# Patient Record
Sex: Female | Born: 1990
Health system: Southern US, Community
[De-identification: ages and names within clinical notes are randomized; demographics above are authoritative.]

## PROBLEM LIST (undated history)

## (undated) ENCOUNTER — Inpatient Hospital Stay (HOSPITAL_COMMUNITY): Payer: Self-pay

## (undated) DIAGNOSIS — T7840XA Allergy, unspecified, initial encounter: Secondary | ICD-10-CM

## (undated) DIAGNOSIS — A609 Anogenital herpesviral infection, unspecified: Secondary | ICD-10-CM

## (undated) DIAGNOSIS — T8859XA Other complications of anesthesia, initial encounter: Secondary | ICD-10-CM

## (undated) DIAGNOSIS — T4145XA Adverse effect of unspecified anesthetic, initial encounter: Secondary | ICD-10-CM

## (undated) DIAGNOSIS — F419 Anxiety disorder, unspecified: Secondary | ICD-10-CM

## (undated) HISTORY — DX: Anxiety disorder, unspecified: F41.9

## (undated) HISTORY — PX: CHOLECYSTECTOMY: SHX55

## (undated) HISTORY — DX: Allergy, unspecified, initial encounter: T78.40XA

## (undated) HISTORY — DX: Anogenital herpesviral infection, unspecified: A60.9

---
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
## 2015-04-09 ENCOUNTER — Emergency Department (HOSPITAL_COMMUNITY)
Admission: EM | Admit: 2015-04-09 | Discharge: 2015-04-10 | Disposition: A | Discharge status: Home / Self Care | Payer: Self-pay

## 2015-08-17 ENCOUNTER — Ambulatory Visit (INDEPENDENT_AMBULATORY_CARE_PROVIDER_SITE_OTHER): Payer: BLUE CROSS/BLUE SHIELD | Admitting: Family Medicine

## 2015-08-17 VITALS — BP 102/60 | HR 65 | Temp 98.4°F | Resp 16 | Ht 65.3 in | Wt 138.0 lb

## 2015-08-17 DIAGNOSIS — J029 Acute pharyngitis, unspecified: Secondary | ICD-10-CM | POA: Diagnosis not present

## 2015-08-17 MED ORDER — AMOXICILLIN 500 MG PO CAPS: 500.0000 mg | ORAL_CAPSULE | Freq: Three times a day (TID) | ORAL | Status: DC

## 2015-08-17 NOTE — Progress Notes (Signed)
Subjective:  This chart was scribed for Joan Sidle MD, by Joan Guerra, at [facility].  This patient was seen in room 2 and the patient's care was started at 9:30 AM.   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   MRN: [id number]  HPI  HPI Comments: Joan Guerra is a 24 y.o. female who presents to the [facility] complaining of a sore throat onset two days ago with associated symptoms of a headache, sinus pressure and "foggy ears".  She took Advil Cold and Sinus this morning but denies any relief.   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   Denies nausea or vomiting.      There are no active problems to display for this patient.  Past Medical History  Diagnosis Date   Allergy    Anxiety    Past Surgical History  Procedure Laterality Date   Cholecystectomy     Allergies  Allergen Reactions   Azithromycin Rash   Prior to Admission medications   Not on File   Social History   Social History   Marital Status: Married    Spouse Name: N/A   Number of Children: N/A   Years of Education: N/A   Occupational History   Not on file.   Social History Main Topics   Smoking status: Never Smoker    Smokeless tobacco: Not on file   Alcohol Use: 1.2 oz/week    2 Standard drinks or equivalent per week   Drug Use: No   Sexual Activity: Not on file   Other Topics Concern   Not on file   Social History Narrative   No narrative on file     Review of Systems  Constitutional: Negative for fever and chills.  HENT: Positive for sinus pressure and sore throat.   Eyes: Negative for pain, redness and itching.  Respiratory: Negative  for cough, choking and shortness of breath.   Gastrointestinal: Negative for nausea and vomiting.  Musculoskeletal: Negative for neck pain and neck stiffness.  Neurological: Positive for headaches.       Objective:   Physical Exam  Constitutional: She is oriented to person, place, and time. She appears well-developed and well-nourished. No distress.  HENT:  Head: Normocephalic and atraumatic.  Right Ear: External ear normal.  Left Ear: External ear normal.  Posterior pharynx is red and mildly swollen without exudates  Eyes: Pupils are equal, round, and reactive to light.  Neck: Normal range of motion. Neck supple.  Cardiovascular: Normal rate and regular rhythm.   Pulmonary/Chest: Effort normal and breath sounds normal. No respiratory distress.  Musculoskeletal: Normal range of motion.  Lymphadenopathy:    She has no cervical adenopathy.  Neurological: She is alert and oriented to person, place, and time.  Skin: Skin is warm and dry.    Filed Vitals:   08/17/15 0906  BP: 102/60  Pulse: 65  Temp: 98.4 F (36.9 C)  TempSrc: Oral  Resp: 16  Height: 5' 5.3" (1.659 m)  Weight: 138 lb (62.596 kg)  SpO2: 98%         Assessment & Plan:   This  chart was scribed in my presence and reviewed by me personally.    ICD-9-CM ICD-10-CM   1. Sore throat 462 J02.9 Culture, Group A Strep     amoxicillin (AMOXIL) 500 MG capsule     Signed, Joan SidleKurt Lauenstein, MD

## 2015-08-17 NOTE — Patient Instructions (Signed)
3 books on history: March of the 10,000 , 1453, battle of the ice

## 2015-08-18 LAB — CULTURE, GROUP A STREP: Organism ID, Bacteria: NORMAL

## 2016-09-17 NOTE — L&D Delivery Note (Signed)
Delivery Note At 5:37 AM, on August 31, 2017, a viable female "Joan Guerra" was delivered via  (Presentation:Left Occiput Anterior with restitution to ROT ). Shoulders delivered easily and infant with good tone and spontaneous cry. Tactile stimulation given by provider and infant placed on mother's abdomen where nurse continued tactile stimulation. Infant   APGAR: 8, 9. Cord clamped, cut, and blood collected. Placenta delivered spontaneously and noted to be intact with 3VC upon inspection.  Vaginal inspection revealed bilateral periurethral lacerations that was repaired with 3-0 vicryl on SH.  ~175mL of 1% lidocaine necessary and patient tolerated the procedure well. Fundus firm, at the umbilicus, and bleeding small.  Mother hemodynamically stable and infant skin to skin prior to provider exit.  Mother unsure of birth control method and opts to breastfeed.  Infant weight at one hour of life: Pending  Anesthesia:  Epidural, Local for Repair Episiotomy: None Lacerations: Periurethral Suture Repair: 3.0 vicryl Est. Blood Loss (mL):    Mom to postpartum.  Baby to Couplet care / Skin to Skin.  Cherre RobinsJessica L Divina Neale MSN, CNM 08/31/2017, 6:16 AM

## 2016-10-03 ENCOUNTER — Ambulatory Visit (INDEPENDENT_AMBULATORY_CARE_PROVIDER_SITE_OTHER): Payer: BLUE CROSS/BLUE SHIELD | Admitting: Orthopaedic Surgery

## 2017-01-07 LAB — OB RESULTS CONSOLE HEPATITIS B SURFACE ANTIGEN: Hepatitis B Surface Ag: NEGATIVE

## 2017-01-07 LAB — OB RESULTS CONSOLE ANTIBODY SCREEN: Antibody Screen: NEGATIVE

## 2017-01-07 LAB — OB RESULTS CONSOLE HIV ANTIBODY (ROUTINE TESTING): HIV: NONREACTIVE

## 2017-01-07 LAB — OB RESULTS CONSOLE RPR: RPR: NONREACTIVE

## 2017-01-07 LAB — OB RESULTS CONSOLE ABO/RH: RH Type: POSITIVE

## 2017-01-07 LAB — OB RESULTS CONSOLE RUBELLA ANTIBODY, IGM: Rubella: IMMUNE

## 2017-02-07 LAB — OB RESULTS CONSOLE GC/CHLAMYDIA
Chlamydia: NEGATIVE
Gonorrhea: NEGATIVE

## 2017-02-14 ENCOUNTER — Other Ambulatory Visit: Payer: Self-pay | Admitting: Obstetrics and Gynecology

## 2017-02-14 ENCOUNTER — Other Ambulatory Visit (HOSPITAL_COMMUNITY)
Admission: RE | Admit: 2017-02-14 | Discharge: 2017-02-14 | Disposition: A | Discharge status: Home / Self Care | Payer: BLUE CROSS/BLUE SHIELD | Source: Ambulatory Visit | Attending: Obstetrics and Gynecology | Admitting: Obstetrics and Gynecology

## 2017-02-14 DIAGNOSIS — Z124 Encounter for screening for malignant neoplasm of cervix: Secondary | ICD-10-CM | POA: Diagnosis present

## 2017-02-18 LAB — CYTOLOGY - PAP
Chlamydia: NEGATIVE
Diagnosis: NEGATIVE
Neisseria Gonorrhea: NEGATIVE

## 2017-03-10 ENCOUNTER — Telehealth: Payer: Self-pay | Admitting: Obstetrics and Gynecology

## 2017-03-10 NOTE — Telephone Encounter (Signed)
TC from patient--16 weeks, patient of Dr. Richardson Doppole, with mid back tightness/soreness, just under ribcage, moderate discomfort.  Denies dysuria, urgency, fever, d/c, bleeding, or any other sx.  Used [employer] Balm with benefit.  Encouraged to contact [facility] tomorrow for evaluation, call after hours with any worsening sx.

## 2017-07-31 LAB — OB RESULTS CONSOLE GBS: GBS: NEGATIVE

## 2017-08-16 ENCOUNTER — Institutional Professional Consult (permissible substitution): Payer: BLUE CROSS/BLUE SHIELD | Admitting: Pediatrics

## 2017-08-28 ENCOUNTER — Encounter (HOSPITAL_COMMUNITY): Payer: Self-pay | Admitting: *Deleted

## 2017-08-28 ENCOUNTER — Telehealth (HOSPITAL_COMMUNITY): Payer: Self-pay | Admitting: *Deleted

## 2017-08-28 NOTE — Telephone Encounter (Signed)
Preadmission screen  

## 2017-08-29 ENCOUNTER — Inpatient Hospital Stay (HOSPITAL_COMMUNITY)
Admission: AD | Admit: 2017-08-29 | Discharge: 2017-08-30 | Disposition: A | Discharge status: Home / Self Care | Payer: BLUE CROSS/BLUE SHIELD | Source: Ambulatory Visit | Attending: Obstetrics and Gynecology | Admitting: Obstetrics and Gynecology

## 2017-08-29 DIAGNOSIS — O479 False labor, unspecified: Secondary | ICD-10-CM

## 2017-08-30 ENCOUNTER — Encounter (HOSPITAL_COMMUNITY): Payer: Self-pay

## 2017-08-30 ENCOUNTER — Inpatient Hospital Stay (HOSPITAL_COMMUNITY)
Admission: AD | Admit: 2017-08-30 | Discharge: 2017-09-01 | DRG: 807 | Disposition: A | Discharge status: Home / Self Care | Payer: BLUE CROSS/BLUE SHIELD | Source: Ambulatory Visit | Attending: Obstetrics & Gynecology | Admitting: Obstetrics & Gynecology

## 2017-08-30 ENCOUNTER — Inpatient Hospital Stay (HOSPITAL_COMMUNITY): Payer: BLUE CROSS/BLUE SHIELD | Admitting: Anesthesiology

## 2017-08-30 ENCOUNTER — Other Ambulatory Visit: Payer: Self-pay | Admitting: Obstetrics and Gynecology

## 2017-08-30 DIAGNOSIS — Z3A40 40 weeks gestation of pregnancy: Secondary | ICD-10-CM | POA: Diagnosis not present

## 2017-08-30 DIAGNOSIS — O26893 Other specified pregnancy related conditions, third trimester: Secondary | ICD-10-CM | POA: Diagnosis present

## 2017-08-30 DIAGNOSIS — Z3483 Encounter for supervision of other normal pregnancy, third trimester: Secondary | ICD-10-CM | POA: Diagnosis present

## 2017-08-30 DIAGNOSIS — R030 Elevated blood-pressure reading, without diagnosis of hypertension: Secondary | ICD-10-CM | POA: Diagnosis present

## 2017-08-30 LAB — CBC
HCT: 35.8 % — ABNORMAL LOW (ref 36.0–46.0)
Hemoglobin: 12.6 g/dL (ref 12.0–15.0)
MCH: 31.7 pg (ref 26.0–34.0)
MCHC: 35.2 g/dL (ref 30.0–36.0)
MCV: 90.2 fL (ref 78.0–100.0)
Platelets: 304 10*3/uL (ref 150–400)
RBC: 3.97 MIL/uL (ref 3.87–5.11)
RDW: 12.8 % (ref 11.5–15.5)
WBC: 19.7 10*3/uL — ABNORMAL HIGH (ref 4.0–10.5)

## 2017-08-30 LAB — TYPE AND SCREEN
ABO/RH(D): A POS
Antibody Screen: NEGATIVE

## 2017-08-30 LAB — PROTEIN / CREATININE RATIO, URINE
Creatinine, Urine: 93 mg/dL
Protein Creatinine Ratio: 0.15 mg/mg{Cre} (ref 0.00–0.15)
Total Protein, Urine: 14 mg/dL

## 2017-08-30 LAB — ABO/RH: ABO/RH(D): A POS

## 2017-08-30 MED ORDER — ACETAMINOPHEN 325 MG PO TABS: 650.0000 mg | ORAL_TABLET | ORAL | Status: DC | PRN

## 2017-08-30 MED ORDER — SOD CITRATE-CITRIC ACID 500-334 MG/5ML PO SOLN: 30.0000 mL | ORAL | Status: DC | PRN

## 2017-08-30 MED ORDER — LACTATED RINGERS IV SOLN
INTRAVENOUS | Status: DC
  Administered 2017-08-30 (×3): via INTRAVENOUS

## 2017-08-30 MED ORDER — OXYCODONE-ACETAMINOPHEN 5-325 MG PO TABS: 1.0000 | ORAL_TABLET | ORAL | Status: DC | PRN

## 2017-08-30 MED ORDER — EPHEDRINE 5 MG/ML INJ
10.0000 mg | INTRAVENOUS | Status: DC | PRN
  Filled 2017-08-30: qty 2

## 2017-08-30 MED ORDER — OXYTOCIN 40 UNITS IN LACTATED RINGERS INFUSION - SIMPLE MED
1.0000 m[IU]/min | INTRAVENOUS | Status: DC
Start: 2017-08-31 — End: 2017-08-31
  Administered 2017-08-31: 1 m[IU]/min via INTRAVENOUS
  Filled 2017-08-30: qty 1000

## 2017-08-30 MED ORDER — OXYTOCIN BOLUS FROM INFUSION
500.0000 mL | Freq: Once | INTRAVENOUS | Status: AC
  Administered 2017-08-31: 500 mL via INTRAVENOUS

## 2017-08-30 MED ORDER — PHENYLEPHRINE 40 MCG/ML (10ML) SYRINGE FOR IV PUSH (FOR BLOOD PRESSURE SUPPORT)
80.0000 ug | PREFILLED_SYRINGE | INTRAVENOUS | Status: AC | PRN
  Administered 2017-08-30 (×3): 80 ug via INTRAVENOUS

## 2017-08-30 MED ORDER — LACTATED RINGERS IV SOLN: 500.0000 mL | Freq: Once | INTRAVENOUS | Status: DC

## 2017-08-30 MED ORDER — ONDANSETRON HCL 4 MG/2ML IJ SOLN
4.0000 mg | Freq: Four times a day (QID) | INTRAMUSCULAR | Status: DC | PRN
  Administered 2017-08-30: 4 mg via INTRAVENOUS
  Filled 2017-08-30: qty 2

## 2017-08-30 MED ORDER — OXYCODONE-ACETAMINOPHEN 5-325 MG PO TABS: 2.0000 | ORAL_TABLET | ORAL | Status: DC | PRN

## 2017-08-30 MED ORDER — DIPHENHYDRAMINE HCL 50 MG/ML IJ SOLN: 12.5000 mg | INTRAMUSCULAR | Status: DC | PRN

## 2017-08-30 MED ORDER — LIDOCAINE HCL (PF) 1 % IJ SOLN
30.0000 mL | INTRAMUSCULAR | Status: DC | PRN
  Administered 2017-08-31: 30 mL via SUBCUTANEOUS
  Filled 2017-08-30: qty 30

## 2017-08-30 MED ORDER — FENTANYL 2.5 MCG/ML BUPIVACAINE 1/10 % EPIDURAL INFUSION (WH - ANES)
14.0000 mL/h | INTRAMUSCULAR | Status: DC | PRN
  Administered 2017-08-30: 2 mL/h via EPIDURAL
  Filled 2017-08-30: qty 100

## 2017-08-30 MED ORDER — LIDOCAINE HCL (PF) 1 % IJ SOLN
INTRAMUSCULAR | Status: DC | PRN
  Administered 2017-08-30: 4 mL

## 2017-08-30 MED ORDER — LACTATED RINGERS IV SOLN
500.0000 mL | Freq: Once | INTRAVENOUS | Status: AC
  Administered 2017-08-30: 500 mL via INTRAVENOUS

## 2017-08-30 MED ORDER — TERBUTALINE SULFATE 1 MG/ML IJ SOLN
0.2500 mg | Freq: Once | INTRAMUSCULAR | Status: DC | PRN
  Filled 2017-08-30: qty 1

## 2017-08-30 MED ORDER — OXYTOCIN 40 UNITS IN LACTATED RINGERS INFUSION - SIMPLE MED
2.5000 [IU]/h | INTRAVENOUS | Status: DC
  Administered 2017-08-31: 2.5 [IU]/h via INTRAVENOUS

## 2017-08-30 MED ORDER — FENTANYL CITRATE (PF) 100 MCG/2ML IJ SOLN: 50.0000 ug | INTRAMUSCULAR | Status: DC | PRN

## 2017-08-30 MED ORDER — LACTATED RINGERS IV SOLN
500.0000 mL | INTRAVENOUS | Status: DC | PRN
  Administered 2017-08-30: 500 mL via INTRAVENOUS

## 2017-08-30 MED ORDER — PHENYLEPHRINE 40 MCG/ML (10ML) SYRINGE FOR IV PUSH (FOR BLOOD PRESSURE SUPPORT)
80.0000 ug | PREFILLED_SYRINGE | INTRAVENOUS | Status: DC | PRN
  Administered 2017-08-30: 80 ug via INTRAVENOUS
  Filled 2017-08-30: qty 5
  Filled 2017-08-30: qty 10

## 2017-08-30 NOTE — H&P (Deleted)
  The note originally documented on this encounter has been moved the the encounter in which it belongs.  

## 2017-08-30 NOTE — Progress Notes (Signed)
Joan Guerra MRN: [id number]  Subjective: -Care assumed of 26 y.o. G1P0 at 3129w2d who presents for active labor. [facility] is under the care of  [facility] and is attended by Dr. Delrae Sawyers. [name]. Pregnancy and medical history unremarkable.  In room to meet acquaintance of patient and family.  Patient reports discomfort despite epidural placement.  However, patient breathing well through contractions.  No q/c.  Objective: BP (!) 137/92   Pulse (!) 110   Temp 98.9 F (37.2 C) (Oral)   Resp 19   Ht 5\' 5"  (1.651 m)   Wt 74.8 kg (165 lb)   LMP 11/21/2016   SpO2 100%   BMI 27.46 kg/m  No intake/output data recorded. No intake/output data recorded.  Fetal Monitoring: FHT: 145 bpm, Mod Var, +Variable Decels, +Accels UC: Q 2-653min, palpates moderate  Physical Exam: General appearance: oriented to person, place, and time and in mild to moderate distress. Chest: normal rate and regular rhythm.  clear to auscultation, no wheezes, rales or rhonchi, symmetric air entry. Abdominal exam: Soft, RT, BS, Gravid. Extremities: No Edema Skin exam: Warm Dry  Vaginal Exam: SVE:   Dilation: 8 Effacement (%): 100 Station: 0 Exam by:: Joan SwazilandJordan Johnson, RN  Membranes:SROM Internal Monitors: None  Augmentation/Induction: Pitocin:None Cytotec: None  Assessment:  IUP at 40.2wks Cat I FT  Overall Transitional Labor Elevated BP  Plan: -Discussed plan to get patient comfortable as established by anesthesiologist and nurse -PC Ratio  -Will perform PIH labs if PCR abnormal -Continue other mgmt as ordered  Valma CavaJessica L Nyana Haren,MSN, CNM 08/30/2017, 8:13 PM

## 2017-08-30 NOTE — MAU Note (Signed)
Reports cntrx 2 mins apart for past 2 hours.  No leaking or bleeding. + FM

## 2017-08-30 NOTE — Anesthesia Procedure Notes (Signed)
Epidural Patient location during procedure: OB Start time: 08/30/2017 6:10 PM End time: 08/30/2017 6:16 PM  Staffing Anesthesiologist: Shelton SilvasHollis, Guy Toney D, MD Performed: anesthesiologist   Preanesthetic Checklist Completed: patient identified, site marked, surgical consent, pre-op evaluation, timeout performed, IV checked, risks and benefits discussed and monitors and equipment checked  Epidural Patient position: sitting Prep: ChloraPrep Patient monitoring: heart rate, continuous pulse ox and blood pressure Approach: midline Location: L3-L4 Injection technique: LOR saline  Needle:  Needle type: Tuohy  Needle gauge: 17 G Needle length: 9 cm Catheter type: closed end flexible Catheter size: 20 Guage Test dose: negative and 1.5% lidocaine  Assessment Events: blood not aspirated, injection not painful, no injection resistance and no paresthesia  Additional Notes LOR @ 4  Patient identified. Risks/Benefits/Options discussed with patient including but not limited to bleeding, infection, nerve damage, paralysis, failed block, incomplete pain control, headache, blood pressure changes, nausea, vomiting, reactions to medications, itching and postpartum back pain. Confirmed with bedside nurse the patient's most recent platelet count. Confirmed with patient that they are not currently taking any anticoagulation, have any bleeding history or any family history of bleeding disorders. Patient expressed understanding and wished to proceed. All questions were answered. Sterile technique was used throughout the entire procedure. Please see nursing notes for vital signs. Test dose was given through epidural catheter and negative prior to continuing to dose epidural or start infusion. Warning signs of high block given to the patient including shortness of breath, tingling/numbness in hands, complete motor block, or any concerning symptoms with instructions to call for help. Patient was given instructions  on fall risk and not to get out of bed. All questions and concerns addressed with instructions to call with any issues or inadequate analgesia.    Reason for block:procedure for pain

## 2017-08-30 NOTE — Anesthesia Preprocedure Evaluation (Signed)
Anesthesia Evaluation  Patient identified by MRN, date of birth, ID band Patient awake    Reviewed: Allergy & Precautions, Patient's Chart, lab work & pertinent test results  Airway Mallampati: I       Dental no notable dental hx.    Pulmonary    Pulmonary exam normal        Cardiovascular negative cardio ROS Normal cardiovascular exam     Neuro/Psych Anxiety    GI/Hepatic   Endo/Other    Renal/GU negative Renal ROS     Musculoskeletal   Abdominal   Peds  Hematology negative hematology ROS (+)   Anesthesia Other Findings   Reproductive/Obstetrics (+) Pregnancy                             Anesthesia Physical Anesthesia Plan  ASA: II  Anesthesia Plan: Epidural   Post-op Pain Management:    Induction:   PONV Risk Score and Plan:   Airway Management Planned:   Additional Equipment:   Intra-op Plan:   Post-operative Plan:   Informed Consent: I have reviewed the patients History and Physical, chart, labs and discussed the procedure including the risks, benefits and alternatives for the proposed anesthesia with the patient or authorized representative who has indicated his/her understanding and acceptance.     Plan Discussed with:   Anesthesia Plan Comments: (Lab Results               PLT                      304                 08/30/2017           )        Anesthesia Quick Evaluation

## 2017-08-30 NOTE — Anesthesia Pain Management Evaluation Note (Signed)
  CRNA Pain Management Visit Note  Patient: Joan Guerra, 26 y.o., female  "Hello I am a member of the anesthesia team at [facility]. We have an anesthesia team available at all times to provide care throughout the hospital, including epidural management and anesthesia for C-section. I don't know your plan for the delivery whether it a natural birth, water birth, IV sedation, nitrous supplementation, doula or epidural, but we want to meet your pain goals."   1.Was your pain managed to your expectations on prior hospitalizations?   No prior hospitalizations  2.What is your expectation for pain management during this hospitalization?     Nitrous Oxide  3.How can we help you reach that goal? N20 in use  Record the patient's initial score and the patient's pain goal.   Pain: 6  Pain Goal: 8 [facility] wants you to be able to say your pain was always managed very well.  Tennile Styles 08/30/2017

## 2017-08-30 NOTE — H&P (Signed)
Joan Guerra is a 26 y.o. female G1 P0 at 40 wks and 2 days presenting for  Admission to birthing suites due to active labor. Contractions started yesterday however began getting stronger at 6 30 am this morning.  Rated as an 8 out of 10. Cervix is 4.5 cm/ 100/ 0 station. NO lof no vaginal bleeding. Pregnnacy has been uncomplicated. Prenatal care provided by Dr. Gerald Leitzara Tawfiq Favila. With [facility].  . OB History    Gravida Para Term Preterm AB Living   1             SAB TAB Ectopic Multiple Live Births                 Past medical history  None  Past GYN history : remote h/o chlamydia Allergies Azithromycin  Past Surgical History:  Procedure Laterality Date  . CHOLECYSTECTOMY     Family History: family history includes Autoimmune disease in her mother; Cancer in her maternal grandmother; Heart disease in her maternal grandmother, mother, and paternal grandmother; Hyperlipidemia in her father, maternal grandmother, mother, and paternal grandmother; Hypertension in her maternal grandmother, mother, and paternal grandmother. Social History:  She denies tobacco/ etoh or illicit drub use.     Maternal Diabetes: No Genetic Screening: Normal Maternal Ultrasounds/Referrals: Normal Fetal Ultrasounds or other Referrals:  None Maternal Substance Abuse:  No Significant Maternal Medications:  None Significant Maternal Lab Results:  Lab values include: Group B Strep negative Other Comments:  None  Review of Systems  Constitutional: Negative.   HENT: Negative.   Eyes: Negative.   Respiratory: Negative.   Cardiovascular: Negative.   Gastrointestinal: Negative.   Genitourinary: Negative.   Musculoskeletal: Negative.   Skin: Negative.   Neurological: Negative.   Endo/Heme/Allergies: Negative.   Psychiatric/Behavioral: Negative.    History   Last menstrual period 11/21/2016. Exam Physical Exam  Vitals reviewed. Constitutional: She is oriented to person, place, and time. She appears  well-developed and well-nourished.  HENT:  Head: Normocephalic and atraumatic.  Eyes: Conjunctivae are normal. Pupils are equal, round, and reactive to light.  Neck: Normal range of motion. Neck supple.  Cardiovascular: Normal rate and regular rhythm.  Respiratory: Effort normal and breath sounds normal.  GI: There is no tenderness.  Genitourinary: Vagina normal.  Musculoskeletal: Normal range of motion. She exhibits no edema.  Neurological: She is alert and oriented to person, place, and time.  Skin: Skin is warm and dry.  Psychiatric: She has a normal mood and affect.   Cervix 4.5 / 100/ 0  Prenatal labs: ABO, Rh: A/Positive/-- (04/23 0000) Antibody: Negative (04/23 0000) Rubella: Immune (04/23 0000) RPR: Nonreactive (04/23 0000)  HBsAg: Negative (04/23 0000)  HIV: Non-reactive (04/23 0000)  GBS: Negative (11/14 0000)   Assessment/Plan: 40 wks and 5 days in active labor  Admit to Birthing Suites Pain control Pt is unsure however Nitrous oxide and epidural ordered if she desires.  Anticipate SVD Dr. Charlotta Newtonzan covering 12 pm to 5 pm.. [facility] covering after 5 pm.   Dominie Benedick J. 08/30/2017, 12:48 PM

## 2017-08-30 NOTE — Discharge Instructions (Signed)

## 2017-08-30 NOTE — Progress Notes (Addendum)
Joan Guerra MRN: [id number]  Subjective: -In room to assess.  Patient reports comfort with epidural.  Nurse reports that position changes causes patient to feel nauseous.  Patient requesting epidural to be turned down due to "feeling funny."  Objective: BP 120/77   Pulse (!) 101   Temp 99.1 F (37.3 C) (Oral)   Resp 17   Ht 5\' 5"  (1.651 m)   Wt 74.8 kg (165 lb)   LMP 11/21/2016   SpO2 100%   BMI 27.46 kg/m  No intake/output data recorded. No intake/output data recorded.  Fetal Monitoring: FHT: 155 bpm, Mod Var, -Decels, +Accels UC: Q2-24min, palpates mild to moderate    Vaginal Exam: SVE:   Dilation: 6-7 Effacement (%): 70 d/t edema at anterior fornix Station: 0 Exam by:: J.Omni Dunsworth, CNM Membranes:SROM Internal Monitors: None  Augmentation/Induction: Pitocin:Discussed Cytotec: None  Assessment:  IUP at 40.2wks Cat I FT  Cervical Edema Inadequate Contractions  Plan: -Discussed r/b of pitocin including fetal intolerance, tachysystole, as well as stronger contractions and decreased labor time -Discussed IUPC r/b, prior to insertion, including increased risk of infection and ability to adequately monitor quantity and strength of contractions. -Patient reports feeling overwhelmed and would like to process information prior to agreeing to interventions -Dr. Kathie RhodesS. Rivard updated on patient status and suggest change to epidural to promote patient comfort and ability to change positions as appropriate -Nurse call reports epidural has been successfully changed -Continue other mgmt as ordered  Joan CavaJessica L Albie Arizpe,MSN, CNM 08/30/2017, 11:11 PM  Addendum ([phone] Nurse call reports patient ready for IUPC placement and pitocin infusion.  Dilation: 7 Effacement (%): 70(edema at anterior fornix) Station: 0 Presentation: Vertex Exam by:: Gerrit HeckJessica Rondia Higginbotham, CNM   IUPC placed w/o difficulty Low Grade Fever Elevated WBCs  Will repeat CBC and add differential  CMP, LDH, UA Start pitocin at  851mUn/min and increase by 312mUn/min  Cherre RobinsJessica L Joan Adler MSN, CNM

## 2017-08-30 NOTE — Progress Notes (Signed)
OB PN:  S: Pt feeling more painful contractions- currently using nitrous  O: BP 137/74   Pulse (!) 104   Temp 98.6 F (37 C) (Oral)   Resp (!) 22   Ht 5\' 5"  (1.651 m)   Wt 74.8 kg (165 lb)   LMP 11/21/2016   SpO2 100%   BMI 27.46 kg/m   FHT: 150bpm, moderate variablity, + accels, no decels Toco: q644min SVE: 6/100/-1  A/P: 26 y.o. G1P0 @ 3715w2d in active labor 1. FWB: Cat. I 2. Labor: continue expectant management, plan for epidural then possible AROM Pain: plan for epidural GBS: neg   Joan HidalgoJennifer Martiza Speth, DO [phone] (pager) [phone] (office)

## 2017-08-31 ENCOUNTER — Encounter (HOSPITAL_COMMUNITY): Payer: Self-pay

## 2017-08-31 LAB — COMPREHENSIVE METABOLIC PANEL
ALT: 15 U/L (ref 14–54)
AST: 24 U/L (ref 15–41)
Albumin: 2.8 g/dL — ABNORMAL LOW (ref 3.5–5.0)
Alkaline Phosphatase: 237 U/L — ABNORMAL HIGH (ref 38–126)
Anion gap: 11 (ref 5–15)
BUN: 12 mg/dL (ref 6–20)
CO2: 17 mmol/L — ABNORMAL LOW (ref 22–32)
Calcium: 8.5 mg/dL — ABNORMAL LOW (ref 8.9–10.3)
Chloride: 107 mmol/L (ref 101–111)
Creatinine, Ser: 0.79 mg/dL (ref 0.44–1.00)
GFR calc Af Amer: >60 mL/min (ref >60)
GFR calc non Af Amer: >60 mL/min (ref >60)
Glucose, Bld: 108 mg/dL — ABNORMAL HIGH (ref 65–99)
Potassium: 3.3 mmol/L — ABNORMAL LOW (ref 3.5–5.1)
Sodium: 135 mmol/L (ref 135–145)
Total Bilirubin: 0.7 mg/dL (ref 0.3–1.2)
Total Protein: 6.2 g/dL — ABNORMAL LOW (ref 6.5–8.1)

## 2017-08-31 LAB — CBC WITH DIFFERENTIAL/PLATELET
Basophils Absolute: 0 10*3/uL (ref 0.0–0.1)
Basophils Relative: 0 %
Eosinophils Absolute: 0 10*3/uL (ref 0.0–0.7)
Eosinophils Relative: 0 %
HCT: 33.4 % — ABNORMAL LOW (ref 36.0–46.0)
Hemoglobin: 11.4 g/dL — ABNORMAL LOW (ref 12.0–15.0)
Lymphocytes Relative: 9 %
Lymphs Abs: 1.7 10*3/uL (ref 0.7–4.0)
MCH: 31.1 pg (ref 26.0–34.0)
MCHC: 34.1 g/dL (ref 30.0–36.0)
MCV: 91.3 fL (ref 78.0–100.0)
Monocytes Absolute: 1.2 10*3/uL — ABNORMAL HIGH (ref 0.1–1.0)
Monocytes Relative: 6 %
Neutro Abs: 16 10*3/uL — ABNORMAL HIGH (ref 1.7–7.7)
Neutrophils Relative %: 85 %
Platelets: 243 10*3/uL (ref 150–400)
RBC: 3.66 MIL/uL — ABNORMAL LOW (ref 3.87–5.11)
RDW: 12.7 % (ref 11.5–15.5)
WBC: 19 10*3/uL — ABNORMAL HIGH (ref 4.0–10.5)

## 2017-08-31 LAB — URIC ACID: Uric Acid, Serum: 6.3 mg/dL (ref 2.3–6.6)

## 2017-08-31 LAB — LACTATE DEHYDROGENASE: LDH: 135 U/L (ref 98–192)

## 2017-08-31 LAB — RPR: RPR Ser Ql: NONREACTIVE

## 2017-08-31 MED ORDER — DIBUCAINE 1 % RE OINT: 1.0000 "application " | TOPICAL_OINTMENT | RECTAL | Status: DC | PRN

## 2017-08-31 MED ORDER — SIMETHICONE 80 MG PO CHEW: 80.0000 mg | CHEWABLE_TABLET | ORAL | Status: DC | PRN

## 2017-08-31 MED ORDER — IBUPROFEN 600 MG PO TABS
600.0000 mg | ORAL_TABLET | Freq: Four times a day (QID) | ORAL | Status: DC
  Administered 2017-08-31 – 2017-09-01 (×6): 600 mg via ORAL
  Filled 2017-08-31 (×6): qty 1

## 2017-08-31 MED ORDER — OXYTOCIN 40 UNITS IN LACTATED RINGERS INFUSION - SIMPLE MED: 1.0000 m[IU]/min | INTRAVENOUS | Status: DC

## 2017-08-31 MED ORDER — ACETAMINOPHEN 325 MG PO TABS
650.0000 mg | ORAL_TABLET | ORAL | Status: DC | PRN
  Administered 2017-08-31 – 2017-09-01 (×4): 650 mg via ORAL
  Filled 2017-08-31 (×4): qty 2

## 2017-08-31 MED ORDER — ZOLPIDEM TARTRATE 5 MG PO TABS: 5.0000 mg | ORAL_TABLET | Freq: Every evening | ORAL | Status: DC | PRN

## 2017-08-31 MED ORDER — WITCH HAZEL-GLYCERIN EX PADS: 1.0000 "application " | MEDICATED_PAD | CUTANEOUS | Status: DC | PRN

## 2017-08-31 MED ORDER — SENNOSIDES-DOCUSATE SODIUM 8.6-50 MG PO TABS
2.0000 | ORAL_TABLET | ORAL | Status: DC
  Administered 2017-08-31: 2 via ORAL
  Filled 2017-08-31: qty 2

## 2017-08-31 MED ORDER — DIPHENHYDRAMINE HCL 25 MG PO CAPS: 25.0000 mg | ORAL_CAPSULE | Freq: Four times a day (QID) | ORAL | Status: DC | PRN

## 2017-08-31 MED ORDER — PRENATAL MULTIVITAMIN CH
1.0000 | ORAL_TABLET | Freq: Every day | ORAL | Status: DC
  Administered 2017-08-31 – 2017-09-01 (×2): 1 via ORAL
  Filled 2017-08-31 (×2): qty 1

## 2017-08-31 MED ORDER — ONDANSETRON HCL 4 MG PO TABS: 4.0000 mg | ORAL_TABLET | ORAL | Status: DC | PRN

## 2017-08-31 MED ORDER — ONDANSETRON HCL 4 MG/2ML IJ SOLN: 4.0000 mg | INTRAMUSCULAR | Status: DC | PRN

## 2017-08-31 MED ORDER — TETANUS-DIPHTH-ACELL PERTUSSIS 5-2.5-18.5 LF-MCG/0.5 IM SUSP: 0.5000 mL | Freq: Once | INTRAMUSCULAR | Status: DC

## 2017-08-31 MED ORDER — LIDOCAINE-EPINEPHRINE (PF) 2 %-1:200000 IJ SOLN
INTRAMUSCULAR | Status: DC | PRN
  Administered 2017-08-31: 5 mL via EPIDURAL

## 2017-08-31 MED ORDER — BENZOCAINE-MENTHOL 20-0.5 % EX AERO
1.0000 "application " | INHALATION_SPRAY | CUTANEOUS | Status: DC | PRN
  Administered 2017-08-31: 1 via TOPICAL
  Filled 2017-08-31: qty 56

## 2017-08-31 MED ORDER — COCONUT OIL OIL: 1.0000 "application " | TOPICAL_OIL | Status: DC | PRN

## 2017-08-31 NOTE — Progress Notes (Signed)
Pt. Refusing increase in pitocin until pain controled. Anesthesia and midwife notified.

## 2017-08-31 NOTE — Progress Notes (Signed)
Joan Guerra MRN: [id number]  Subjective: -Nurse call requests confirmation of VE.  In room to assess.  Patient reports "hot spot" on right side and requests anesthesia assistance.    Objective: BP (!) 112/56   Pulse (!) 127   Temp 97.9 F (36.6 C) (Oral)   Resp 17   Ht 5\' 5"  (1.651 m)   Wt 74.8 kg (165 lb)   LMP 11/21/2016   SpO2 100%   BMI 27.46 kg/m  No intake/output data recorded. No intake/output data recorded.  Fetal Monitoring: FHT: 130 bpm, Mod Var, +Early Decels, +Accels UC: Q1-2 min, palpates moderate, MVUs 115    Vaginal Exam: SVE:   Dilation: Lip/rim Effacement (%): 70(edema at anterior fornix) Station: +2 Exam by:: Braleigh Massoud CNM Membranes:SROM x  Internal Monitors: IUPC  Augmentation/Induction: Pitocin:393mUn/min Cytotec: None  Assessment:  IUP at 40.3wks Cat I FT  Labor Augmentation  Plan: -Discussed cervical lip and need for position change -Anesthesia to be contacted regarding hot spot -Anticipate SVD -Continue other mgmt as ordered   Joan CavaJessica L Lakelyn Straus,MSN, CNM 08/31/2017, 2:58 AM

## 2017-08-31 NOTE — Anesthesia Postprocedure Evaluation (Signed)
Anesthesia Post Note  Patient: Joan Guerra  Procedure(s) Performed: AN AD HOC LABOR EPIDURAL     Patient location during evaluation: Mother Baby Anesthesia Type: Epidural Level of consciousness: awake and alert, oriented and patient cooperative Pain management: pain level controlled Vital Signs Assessment: post-procedure vital signs reviewed and stable Respiratory status: spontaneous breathing Cardiovascular status: stable Postop Assessment: no headache, epidural receding, patient able to bend at knees and no signs of nausea or vomiting Anesthetic complications: no Comments: Pain score 1.    Last Vitals:  Vitals:   08/31/17 0802 08/31/17 0900  BP: 125/82 120/71  Pulse: 75 70  Resp: 16 16  Temp: 36.7 C 36.4 C  SpO2:      Last Pain:  Vitals:   08/31/17 1100  TempSrc:   PainSc: Asleep   Pain Goal:                 [facility] LLCWRINKLE,[name]

## 2017-08-31 NOTE — Lactation Note (Signed)
This note was copied from a baby's chart. Lactation Consultation Note  Patient Name: [name] ZOXWR'UToday's Date: 08/31/2017  Baby at 12 hr of life. Upon entry dyad was sleeping. Gave Dad the handouts and instructions to call for lactation at the next bf.    [name] 08/31/2017, 5:41 PM

## 2017-09-01 LAB — CBC
HCT: 29 % — ABNORMAL LOW (ref 36.0–46.0)
Hemoglobin: 9.9 g/dL — ABNORMAL LOW (ref 12.0–15.0)
MCH: 31.6 pg (ref 26.0–34.0)
MCHC: 34.1 g/dL (ref 30.0–36.0)
MCV: 92.7 fL (ref 78.0–100.0)
Platelets: 232 10*3/uL (ref 150–400)
RBC: 3.13 MIL/uL — ABNORMAL LOW (ref 3.87–5.11)
RDW: 13.2 % (ref 11.5–15.5)
WBC: 12.9 10*3/uL — ABNORMAL HIGH (ref 4.0–10.5)

## 2017-09-01 MED ORDER — IBUPROFEN 600 MG PO TABS: 600.0000 mg | ORAL_TABLET | Freq: Four times a day (QID) | ORAL | 0 refills | Status: DC | PRN

## 2017-09-01 NOTE — Lactation Note (Signed)
This note was copied from a baby's chart. Lactation Consultation Note Baby 25 hrs old, sleepy at this moment. Baby last fed at 0400. Baby sleepy not interested in BF. Mom has short shaft everted nipples. Encouraged to stimulate w/finger tips. Hand expressed colostrum. Mom has limited mobility to Rt. Arm can't rotate inwards. Mom having difficulty latching to Rt. Breast. Assisted in football hold. Then tried cradle. Mom having difficulty. Discussed props, support, and positioning. Newborn behavior discussed, STS, I&O, cluster feeding, and engorgement. Mom encouraged to feed baby 8-12 times/24 hours and with feeding cues.  Call for assistance in feeding and positioning. Mom plans on early d/c today. WH/LC brochure given w/resources, support groups and LC services. Patient Name: Girl Tommie ArdKelly Hardgrave NWGNF'AToday's Date: 09/01/2017 Reason for consult: Initial assessment   Maternal Data Has patient been taught Hand Expression?: Yes Does the patient have breastfeeding experience prior to this delivery?: No  Feeding Feeding Type: Breast Fed Length of feed: 0 min  LATCH Score Latch: Too sleepy or reluctant, no latch achieved, no sucking elicited.  Audible Swallowing: None  Type of Nipple: Everted at rest and after stimulation  Comfort (Breast/Nipple): Soft / non-tender  Hold (Positioning): Full assist, staff holds infant at breast  LATCH Score: 4  Interventions Interventions: Breast feeding basics reviewed;Breast compression;Assisted with latch;Adjust position;Skin to skin;Support pillows;Breast massage;Position options;Hand express  Lactation Tools Discussed/Used     Consult Status Consult Status: Follow-up Date: 09/01/17 Follow-up type: In-patient    Terina Mcelhinny, Diamond NickelLAURA G 09/01/2017, 7:06 AM

## 2017-09-01 NOTE — Discharge Summary (Signed)
OB Discharge Summary     Patient Name: Joan Guerra DOB: 06-25-1991 MRN: [id number]  Date of admission: 08/30/2017 Delivering MD: Gerrit HeckEMLY, Duwan Adrian   Date of discharge: 09/01/2017  Admitting diagnosis: LABOR Intrauterine pregnancy: 7464w3d     Secondary diagnosis:  Active Problems:   Normal labor   SVD (spontaneous vaginal delivery)   Periurethral laceration, delivered, current hospitalization  Additional problems: None     Discharge diagnosis: Term Pregnancy Delivered                                                                                                Post partum procedures:None  Augmentation: Pitocin  Complications: None  Hospital course:  Onset of Labor With Vaginal Delivery     26 y.o. yo G1P1001 at 2864w3d was admitted in Active Labor on 08/30/2017. Patient had an uncomplicated labor course as follows:  Membrane Rupture Time/Date: 5:44 PM ,08/30/2017   Intrapartum Procedures: Episiotomy: None [1]                                         Lacerations:  Periurethral [8]  Patient had a delivery of a Viable infant. 08/31/2017  Information for the patient's newborn:  Joan Guerra, Girl Joan Guerra [[id number]][[id number]]  Delivery Method: Vaginal, Spontaneous(Filed from Delivery Summary)    Pateint had an uncomplicated postpartum course.  She is ambulating, tolerating a regular diet, passing flatus, and urinating well. Patient is discharged home in stable condition on 09/01/17.   Physical exam  Vitals:   08/31/17 0802 08/31/17 0900 08/31/17 1300 08/31/17 2100  BP: 125/82 120/71 124/73 117/71  Pulse: 75 70 68 69  Resp: 16 16 18 18   Temp: 98 F (36.7 C) 97.6 F (36.4 C) 97.9 F (36.6 C) 98 F (36.7 C)  TempSrc: Axillary     SpO2:  99% 98%   Weight:      Height:       General: alert, cooperative and no distress Lochia: appropriate Uterine Fundus: firm Incision: N/A DVT Evaluation: No evidence of DVT seen on physical exam. No significant calf/ankle edema. Labs: Lab Results   Component Value Date   WBC 12.9 (H) 09/01/2017   HGB 9.9 (L) 09/01/2017   HCT 29.0 (L) 09/01/2017   MCV 92.7 09/01/2017   PLT 232 09/01/2017   CMP Latest Ref Rng & Units 08/31/2017  Glucose 65 - 99 mg/dL 784(O108(H)  BUN 6 - 20 mg/dL 12  Creatinine 9.620.44 - 9.521.00 mg/dL 8.410.79  Sodium 324135 - 401145 mmol/L 135  Potassium 3.5 - 5.1 mmol/L 3.3(L)  Chloride 101 - 111 mmol/L 107  CO2 22 - 32 mmol/L 17(L)  Calcium 8.9 - 10.3 mg/dL 0.2(V8.5(L)  Total Protein 6.5 - 8.1 g/dL 6.2(L)  Total Bilirubin 0.3 - 1.2 mg/dL 0.7  Alkaline Phos 38 - 126 U/L 237(H)  AST 15 - 41 U/L 24  ALT 14 - 54 U/L 15    Discharge instruction: per After Visit Summary and "Baby and Me Booklet". Pain Management, Peri-Care, Breastfeeding, Who and When  to call for postpartum complications. Information Sheet(s) given PPD& BB, Contraception Choices.   After visit meds:  Allergies as of 09/01/2017      Reactions   Buspirone Nausea And Vomiting, Nausea Only   And double vision.   Azithromycin Rash      Medication List    TAKE these medications   ibuprofen 600 MG tablet Commonly known as:  ADVIL,MOTRIN Take 1 tablet (600 mg total) by mouth every 6 (six) hours as needed.   prenatal multivitamin Tabs tablet Take 1 tablet by mouth daily at 12 noon.   valACYclovir 500 MG tablet Commonly known as:  VALTREX Take 500 mg by mouth 2 (two) times daily as needed.       Diet: routine diet  Activity: Advance as tolerated. Pelvic rest for 6 weeks.   Outpatient follow up:6 weeks Follow up Appt:No future appointments. Follow up Visit:No Follow-up on file.  Postpartum contraception: Undecided  Newborn Data: Live born female  Birth Weight: 7 lb 15.5 oz (3615 g) APGAR: 8, 9  Newborn Delivery   Birth date/time:  08/31/2017 05:37:00 Delivery type:  Vaginal, Spontaneous     Baby Feeding: Breast Disposition:home with mother   09/01/2017 Joan Guerra, CNM

## 2017-09-01 NOTE — Clinical Social Work Note (Signed)
CSW received consult for hx of Anxiety and Depression.  CSW met with [name] to offer support and complete assessment.    [name] reported that she had experienced anxiety while in college in 2012.  She reported that she was not on any medication for the anxiety and has not had any symptoms for the past two years. She declined any need for outpatient mental health services at this time.    [name] reported that she had taken natural childbirth classes at [facility] and was pleased that she had attended these classes stating they were helpful.  [name] also reported that she had a Doula throughout her child birth and had already set up a post partem Doula.  [name] reported that her OB was Dr. [name] with [facility] and her newborn would be followed by Dr. Keiffer at  Peds.    CSW provided education regarding the baby blues period vs. perinatal mood disorders, discussed treatment and gave resources for mental health follow up if concerns arise.  CSW recommended self-evaluation during the postpartum time period using the New Mom Checklist from Postpartum Progress and encouraged [name] to contact a medical professional if symptoms are noted at any time.  [name] denied any SI , HI or DV history.     CSW provided review of Sudden Infant Death Syndrome (SIDS) precautions.   CSW identifies no further need for intervention and no barriers to discharge at this time.  . , LCSW [facility] Clinical Social Worker - Weekend Coverage cell #: [phone]  

## 2017-09-01 NOTE — Lactation Note (Signed)
This note was copied from a baby's chart. Lactation Consultation Note Mom resting, baby getting hearing screen. Encouraged to call for next feeding assistance or questions. Left brochure. Mom stated BF going ok. Patient Name: Joan Guerra: 09/01/2017     Maternal Data    Feeding    LATCH Score                   Interventions    Lactation Tools Discussed/Used     Consult Status      Vivica Dobosz G 09/01/2017, 3:00 AM

## 2017-09-08 ENCOUNTER — Inpatient Hospital Stay (HOSPITAL_COMMUNITY): Payer: BLUE CROSS/BLUE SHIELD

## 2018-06-04 MED FILL — DOXYCYCLINE HYCLATE 100 MG: 100 | 10 days supply | Qty: 20 | Fill #0

## 2018-06-16 MED FILL — FLUCONAZOLE 100 MG TABLET: 100 | 10 days supply | Qty: 3 | Fill #0

## 2018-06-20 ENCOUNTER — Ambulatory Visit (INDEPENDENT_AMBULATORY_CARE_PROVIDER_SITE_OTHER): Payer: Self-pay | Admitting: Obstetrics and Gynecology

## 2018-06-20 DIAGNOSIS — Z23 Encounter for immunization: Secondary | ICD-10-CM

## 2018-06-20 NOTE — Progress Notes (Signed)
Pt presents here today for visit to receive INFLUENZA IMMUNIZATION vaccine. Allergies reviewed, vaccine given, vaccine information statement provided, tolerated well.

## 2018-06-21 ENCOUNTER — Emergency Department (HOSPITAL_COMMUNITY)
Admission: EM | Admit: 2018-06-21 | Discharge: 2018-06-21 | Disposition: A | Discharge status: Home / Self Care | Payer: No Typology Code available for payment source | Attending: Emergency Medicine | Admitting: Emergency Medicine

## 2018-06-21 ENCOUNTER — Encounter (HOSPITAL_COMMUNITY): Payer: Self-pay | Admitting: *Deleted

## 2018-06-21 ENCOUNTER — Other Ambulatory Visit: Payer: Self-pay

## 2018-06-21 ENCOUNTER — Emergency Department (HOSPITAL_COMMUNITY): Payer: No Typology Code available for payment source

## 2018-06-21 DIAGNOSIS — R1010 Upper abdominal pain, unspecified: Secondary | ICD-10-CM | POA: Diagnosis present

## 2018-06-21 DIAGNOSIS — Z79899 Other long term (current) drug therapy: Secondary | ICD-10-CM | POA: Diagnosis not present

## 2018-06-21 DIAGNOSIS — R112 Nausea with vomiting, unspecified: Secondary | ICD-10-CM | POA: Diagnosis not present

## 2018-06-21 LAB — COMPREHENSIVE METABOLIC PANEL
ALT: 22 U/L (ref 0–44)
AST: 29 U/L (ref 15–41)
Albumin: 4.4 g/dL (ref 3.5–5.0)
Alkaline Phosphatase: 67 U/L (ref 38–126)
Anion gap: 14 (ref 5–15)
BUN: 10 mg/dL (ref 6–20)
CO2: 22 mmol/L (ref 22–32)
Calcium: 9.4 mg/dL (ref 8.9–10.3)
Chloride: 104 mmol/L (ref 98–111)
Creatinine, Ser: 0.63 mg/dL (ref 0.44–1.00)
GFR calc Af Amer: >60 mL/min (ref >60)
GFR calc non Af Amer: >60 mL/min (ref >60)
Glucose, Bld: 88 mg/dL (ref 70–99)
Potassium: 3.5 mmol/L (ref 3.5–5.1)
Sodium: 140 mmol/L (ref 135–145)
Total Bilirubin: 1 mg/dL (ref 0.3–1.2)
Total Protein: 7.3 g/dL (ref 6.5–8.1)

## 2018-06-21 LAB — CBC
HCT: 46.2 % — ABNORMAL HIGH (ref 36.0–46.0)
Hemoglobin: 15.8 g/dL — ABNORMAL HIGH (ref 12.0–15.0)
MCH: 31.5 pg (ref 26.0–34.0)
MCHC: 34.2 g/dL (ref 30.0–36.0)
MCV: 92.2 fL (ref 78.0–100.0)
Platelets: 338 10*3/uL (ref 150–400)
RBC: 5.01 MIL/uL (ref 3.87–5.11)
RDW: 11.9 % (ref 11.5–15.5)
WBC: 7.9 10*3/uL (ref 4.0–10.5)

## 2018-06-21 LAB — URINALYSIS, ROUTINE W REFLEX MICROSCOPIC
Bilirubin Urine: NEGATIVE
Glucose, UA: NEGATIVE mg/dL
Hgb urine dipstick: NEGATIVE
Ketones, ur: 20 mg/dL — AB
Leukocytes, UA: NEGATIVE
Nitrite: NEGATIVE
Protein, ur: NEGATIVE mg/dL
Specific Gravity, Urine: 1.019 (ref 1.005–1.030)
pH: 6 (ref 5.0–8.0)

## 2018-06-21 LAB — I-STAT BETA HCG BLOOD, ED (MC, WL, AP ONLY): I-stat hCG, quantitative: <5 m[IU]/mL (ref <5)

## 2018-06-21 LAB — LIPASE, BLOOD: Lipase: 38 U/L (ref 11–51)

## 2018-06-21 MED ORDER — SODIUM CHLORIDE 0.9 % IV BOLUS
1000.0000 mL | Freq: Once | INTRAVENOUS | Status: AC
  Administered 2018-06-21: 1000 mL via INTRAVENOUS

## 2018-06-21 NOTE — ED Triage Notes (Addendum)
Pt is here with pain under right rib pain and then radiates across abdomen and up.  Pt states she had chole in 2014 and this pain is burning and it is a wave of pain. Pt states right back pain and reports that she does get nauseated.  No history of kidney stones.

## 2018-06-21 NOTE — ED Notes (Addendum)
Went to room to speak with pt about need for urine sample, significant other stated that pt was already in the restroom. When pt returned, notified her of need for sample when next able to urinate. Urine cup at bedside. Warm blankets provided.

## 2018-06-21 NOTE — ED Notes (Signed)
Pt stable, ambulatory, states understanding of discharge instructions 

## 2018-06-21 NOTE — ED Provider Notes (Signed)
[facility] EMERGENCY DEPARTMENT Provider Note   CSN: [id number] Arrival date & time: 06/21/18  1308     History   Chief Complaint Chief Complaint  Patient presents with  . Abdominal Pain    HPI Koren Plyler is a 27 y.o. female.  27yo F w/ PMH including cholecystectomy, anxiety, HSV who p/w abdominal pain.  This morning just prior to arrival, the patient was awakened from sleep with severe upper abdominal pain that she feels starts in her right upper abdomen and radiates across to the middle of her upper abdomen.  Pain sometimes goes up her back and up into her chest but she denies any actual chest pain or breathing problems. Pain is intermittent, coming and going in waves, currently gone.  She has had associated nausea and vomiting.  No diarrhea, constipation, melena, or blood in her stools.  No urinary symptoms or abnormal vaginal bleeding/discharge.  The history is provided by the patient.  Abdominal Pain      Past Medical History:  Diagnosis Date  . Allergy   . Anxiety   . HSV (herpes simplex virus) anogenital infection     Patient Active Problem List   Diagnosis Date Noted  . SVD (spontaneous vaginal delivery) 08/31/2017  . Periurethral laceration, delivered, current hospitalization 08/31/2017  . Normal labor 08/30/2017    Past Surgical History:  Procedure Laterality Date  . CHOLECYSTECTOMY       OB History    Gravida  1   Para  1   Term  1   Preterm      AB      Living  1     SAB      TAB      Ectopic      Multiple  0   Live Births  1            Home Medications    Prior to Admission medications   Medication Sig Start Date End Date Taking? Authorizing Provider  acetaminophen (TYLENOL) 500 MG tablet Take 500 mg by mouth every 6 (six) hours as needed for mild pain or headache.   Yes [provider]  Ca Carbonate-Mag Hydroxide (ROLAIDS PO) Take 1 tablet by mouth daily as needed.   Yes [provider]   Multiple Vitamins-Minerals (MULTIVITAMIN ADULT EXTRA C) CHEW Chew 1 tablet by mouth daily.   Yes [provider]  ibuprofen (ADVIL,MOTRIN) 600 MG tablet Take 1 tablet (600 mg total) by mouth every 6 (six) hours as needed. Patient not taking: Reported on 06/21/2018 09/01/17   Gerrit Heck, CNM    Family History Family History  Problem Relation Age of Onset  . Heart disease Mother   . Hyperlipidemia Mother   . Hypertension Mother   . Autoimmune disease Mother   . Hyperlipidemia Father   . Cancer Maternal Grandmother   . Heart disease Maternal Grandmother   . Hyperlipidemia Maternal Grandmother   . Hypertension Maternal Grandmother   . Heart disease Paternal Grandmother   . Hyperlipidemia Paternal Grandmother   . Hypertension Paternal Grandmother     Social History Social History   Tobacco Use  . Smoking status: Never Smoker  . Smokeless tobacco: Never Used  Substance Use Topics  . Alcohol use: Yes    Alcohol/week: 2.0 standard drinks    Types: 2 Standard drinks or equivalent per week  . Drug use: No     Allergies   Buspirone and Azithromycin   Review of Systems Review  of Systems  Gastrointestinal: Positive for abdominal pain.   All other systems reviewed and are negative except that which was mentioned in HPI   Physical Exam Updated Vital Signs BP 113/73 (BP Location: Right Arm)   Pulse 76   Temp 98.6 F (37 C) (Oral)   Resp 18   Ht 5\' 5"  (1.651 m)   Wt 59 kg   LMP 06/17/2018 (Exact Date)   SpO2 100%   BMI 21.63 kg/m   Physical Exam  Constitutional: She is oriented to person, place, and time. She appears well-developed and well-nourished. No distress.  HENT:  Head: Normocephalic and atraumatic.  Moist mucous membranes  Eyes: Pupils are equal, round, and reactive to light. Conjunctivae are normal.  Neck: Neck supple.  Cardiovascular: Normal rate, regular rhythm and normal heart sounds.  No murmur heard. Pulmonary/Chest: Effort normal and  breath sounds normal.  Abdominal: Soft. Bowel sounds are normal. She exhibits no distension. There is tenderness. There is no rebound, no guarding and negative Murphy's sign.  Very mild mid-epigastric and RUQ TTP  Musculoskeletal: She exhibits no edema.  Neurological: She is alert and oriented to person, place, and time.  Fluent speech  Skin: Skin is warm and dry.  Psychiatric: She has a normal mood and affect. Judgment normal.  Nursing note and vitals reviewed.    ED Treatments / Results  Labs (all labs ordered are listed, but only abnormal results are displayed) Labs Reviewed  CBC - Abnormal; Notable for the following components:      Result Value   Hemoglobin 15.8 (*)    HCT 46.2 (*)    All other components within normal limits  URINALYSIS, ROUTINE W REFLEX MICROSCOPIC - Abnormal; Notable for the following components:   Ketones, ur 20 (*)    All other components within normal limits  LIPASE, BLOOD  COMPREHENSIVE METABOLIC PANEL  I-STAT BETA HCG BLOOD, ED ([facility], [facility], [facility] ONLY)    EKG None  Radiology US Abdomen Limited  Result Date: 06/21/2018 CLINICAL DATA:  Right upper quadrant and epigastric pain EXAM: ULTRASOUND ABDOMEN LIMITED RIGHT UPPER QUADRANT COMPARISON:  None. FINDINGS: Gallbladder: Prior cholecystectomy Common bile duct: Diameter: Normal caliber, 1 mm Liver: No focal lesion identified. Within normal limits in parenchymal echogenicity. Portal vein is patent on color Doppler imaging with normal direction of blood flow towards the liver. IMPRESSION: Unremarkable right upper quadrant ultrasound. Electronically Signed   By: Charlett Nose M.D.   On: 06/21/2018 12:35    Procedures Procedures (including critical care time)  Medications Ordered in ED Medications  sodium chloride 0.9 % bolus 1,000 mL (1,000 mLs Intravenous New Bag/Given 06/21/18 0919)     Initial Impression / Assessment and Plan / ED Course  I have reviewed the triage vital signs and the nursing  notes.  Pertinent labs & imaging results that were available during my care of the patient were reviewed by me and considered in my medical decision making (see chart for details).    Pt non-toxic on exam, reassuring VS. Very mild upper abd tenderness on exam.  She reported that her pain had almost resolved.  Obtained above lab work which shows normal LFTs and lipase, normal CBC, normal UA.  Given normal UA, I highly doubt kidney stones or other renal pathology.  Highly doubt pancreatitis given her normal lipase.  Obtained right upper quadrant ultrasound which was normal, no evidence of common bile duct stone.  No lower abd pain to suggest appendicitis or GYN pathology. No chest pain/SOB  or ongoing pain to suggest aortic dissection or PE.  On reexamination, she remains pain-free and has not had any repeated episodes of pain while in the ED.  She is tolerating p.o.  I discussed supportive measures and follow-up and extensively reviewed return precautions including any turns of pain or new symptoms.  She voiced understanding.  Final Clinical Impressions(s) / ED Diagnoses   Final diagnoses:  Upper abdominal pain  Non-intractable vomiting with nausea, unspecified vomiting type    ED Discharge Orders    None       Little, Ambrose Finland, MD 06/21/18 1310

## 2018-07-01 ENCOUNTER — Encounter: Payer: Self-pay | Admitting: Psychiatry

## 2018-07-01 ENCOUNTER — Ambulatory Visit: Payer: No Typology Code available for payment source | Admitting: Psychiatry

## 2018-07-01 VITALS — BP 101/69 | HR 66 | Ht 65.0 in

## 2018-07-01 DIAGNOSIS — F422 Mixed obsessional thoughts and acts: Secondary | ICD-10-CM | POA: Diagnosis not present

## 2018-07-01 MED ORDER — ALPRAZOLAM 0.25 MG PO TABS: ORAL_TABLET | ORAL | 0 refills | Status: DC

## 2018-07-01 MED ORDER — SERTRALINE HCL 25 MG PO TABS: ORAL_TABLET | ORAL | 1 refills | Status: DC

## 2018-07-01 MED FILL — ALPRAZolam 0.25 MG TABS: 0.25 | 30 days supply | Qty: 15 | Fill #0

## 2018-07-01 MED FILL — SERTRALINE HCL 25 MG TABLET: 25 | 30 days supply | Qty: 30 | Fill #0

## 2018-07-01 NOTE — Progress Notes (Signed)
[facility] MD/PA/NP Initial Note  07/01/2018 3:01 PM Joan Guerra  MRN:  [id number]  Chief Complaint:  Chief Complaint    Anxiety     HPI: Pt is a 27 yo female seen today for initial evaluation after being referred by her therapist, [name], LPC. She reports that she has had anxiety since late high school and early college. She reports that she had a baby in [place] and has had increased anxiety since that time and also may have had some post partum depression. She reports that mood s/s have improved "but I don't feel the same since I had my baby." She reports that she has had more compulsive thoughts and negative thoughts since her child. She reports that she had CBT in the past and found that tools that were effective in the past were no longer helpful.   Reports that she started having "racing thoughts...'what if?what if?' thoughts" statred in high school. Reports that she has perfectionistic thinking and sets high goals for herself and "sometimes unrealistic expectations." Also statred having frequent worry. Started having panic attacks in college. Reports that she drove herself to the ER in college and was told she was having a panic attack. Was later found to have gallstones and cholecystectomy that was causing some radiating chest pain, which then worsened anxiety about somatic complaints since it was reality based once. She reports severe daily anxiety about physical s/s and has pursued extensive medical w/. Reports anxiety is temporarily relieved with reassurance and then starts to worry that something was missed. Reports that several people in her life and family have severe chronic health issues and this is "very triggering" for her. Reports that she has exercised excessively in the past to try to prevent future health issues. Reports that she has some rigid and obsessive thoughts about the foods she eats that could cause health issues. Reports that anxiety about her health  significantly escalated after the birth of her child. Denies obsessive worries about the health of others.    "It's almost like I always have to have something to be fixated on." Reports that she will fixate on different things, such as her diet, exercise, planning a natural birth while pregnant, etc. Reports that she was so focused on having a natural birth "that I hadn't considered anything afterwards" to include making some preparations for her her daughter or purchasing a nursing bra despite wanting to breastfeed. Reports that she then became obsessive about having to breastfeed, even after her milk supply diminished after having the flu.   Reports intrusive thoughts, typically about things that could go wrong or if she accidentally harms someone she loves. Reports severe rumination and that CBT is not helpful for rumination. Reports having some rumination before her daughter was born and now it is more frequent. Reports that she has periods of SOBv and feeling shaky with anxiety. Also notices changes in vision, dizziness, upset stomach, tension headaches, and muscle tension in her neck with anxiety. Denies any recent panic attacks. Reports that she has anxiety about having a panic attack and embarrassed in front of others. Also has anxiety that she is going to have a severe health issue and no one will be around to assist her. Reports significant anxiety about her phsycial health and mortality. "Every little sx turns into this big thing in my head."  She reports that she will recognize that her thoughts are sometimes irrational and/or unlikely, but continues to have severe anxiety about having a severe health  issue.   Reports having to do some repetitive movements with anxiety. Denies compulsive counting or having things balanced. Denies having to check doors or appliances. Denies any excessive fears of contamination. Denies frequent handwashing or sanitizing. She reports sometimes wanting things to be in  a certain way or order. Reports some routines and rituals and has to do things in a certain order. Reports that she has had difficulty adjusting to baby interrupting routines and rituals. Denies anxiety with social situations or groups. Denies any past traumatic experiences.  Recalls feelings of hopelessness with postpartum depression. Also had some loss of appetite with postpartum depression. Had low energy and motivation. Reports that sadness would come and go. Reports that she was socially withdrawn and did not want anyone around and isolated herself. Reports that these s/s lasted about 6 months. Reports that she did not leave the house with her baby until baby was 69-6 months old because she would think about "what if?" different challenges would arise. Reports s/s improved some when she stopped breastfeeding and then felt worse with starting birth control and stopped birth control.   Denies current persistent sad mood. Reports frequent irritability in response to anxiety. Denies any difficulty falling or staying asleep. Typically sleeps 8.5-9 hours a night. Appetite is "ok." Reports recent period a few weeks ago where she was not hungry at all. Reports feeling tired and "exhausted." She reports feeling "exhausted" despite sleeping 8-9 hours a night.  Reports motivation is good and will push through. She reports some difficulty with concentration and will occasionally overlook things or make mistakes she would not typically make. "I'm definitely not getting the same enjoyment out of things but I think it is because I am so preoccupied."  Reports she has had moments of wanting to sleep and not waking up and wondering "what's the point?"  Denies current or past SI.   Denies any depressive episodes prior to postpartum depression. Denies any periods of decreased need for sleep. Reports that she has had periods of impulsive spending. Reports that there was a period in the past in her marriage where she was  involved with someone else. Reports that she wants spent $800 in plane tickets at an auction. Reports periods of increased goal-directed activity and attributes this to having difficulty saying "no." Reports periods of excessive cleaning and organizing- "it was more impulsive than anxiety driving." Reports "I've always been a talkative person" and denies not being able to stop talking. Denies any periods of elevated mood.  Denies h/o AH or VH. Denies paranoia.    Visit Diagnosis:    ICD-10-CM   1. Mixed obsessional thoughts and acts F42.2 sertraline (ZOLOFT) 25 MG tablet    ALPRAZolam (XANAX) 0.25 MG tablet    Past Psychiatric History: Saw a therapist in [place] in the past and started seeing [name], [facility] for therapy. Reports being prescribed meds in the past by medical providers to include student health and ob.  Past Psychiatric Medication Trials: Xanax- helpful. Recalls taking a 1/4 of a 0.25 mg tab Buspar- Migraines and double vision Zoloft- PCP prescribed 25 mg 1/2 tab po qd. Took it x 1 and felt as if she had a panic attack and did not take it again.  Past Medical History:  Past Medical History:  Diagnosis Date  . Allergy   . Anxiety   . HSV (herpes simplex virus) anogenital infection     Past Surgical History:  Procedure Laterality Date  . CHOLECYSTECTOMY  Family Psychiatric History: Reports that she suspects mother has anxiety and father has depression, but they have not sought treatment.   Family History:  Family History  Problem Relation Age of Onset  . Heart disease Mother   . Hyperlipidemia Mother   . Hypertension Mother   . Autoimmune disease Mother   . Hyperlipidemia Father   . Cancer Maternal Grandmother   . Heart disease Maternal Grandmother   . Hyperlipidemia Maternal Grandmother   . Hypertension Maternal Grandmother   . Heart disease Paternal Grandmother   . Hyperlipidemia Paternal Grandmother   . Hypertension Paternal Grandmother      Social History: Born and raised in [place] farm with parents and younger sister. Reports things were "relatively good" growing up. Reports that her mother was very present and father was very absent. Reports that parents are "total opposite" and mother is very empahetic, nurturing, expressive, and strong  In her faith. Bachelor's degree. 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 Married x 4 years and been together x 7 years- "things are great." Had a baby girl, Maurie Boettcher, in December. Reports that daughter is healthy. Reports supportive friends through church and a few close friends. No close family in the area. Likes to walk, read, cook, listen to audiobooks, and be outside. Reports that she tries to do something for herself daily.    Social History   Socioeconomic History  . Marital status: Married    Spouse name: Not on file  . Number of children: Not on file  . Years of education: Not on file  . Highest education level: Not on file  Occupational History  . Not on file  Social Needs  . Financial resource strain: Not on file  . Food insecurity:    Worry: Not on file    Inability: Not on file  . Transportation needs:    Medical: Not on file    Non-medical: Not on file  Tobacco Use  . Smoking status: Never Smoker  . Smokeless tobacco: Never Used  Substance and Sexual Activity  . Alcohol use: Yes    Alcohol/week: 2.0 standard drinks    Types: 2 Standard drinks or equivalent per week  . Drug use: No  . Sexual activity: Yes  Lifestyle  . Physical activity:    Days per week: Not on file    Minutes per session: Not on file  . Stress: Not on file  Relationships  . Social connections:    Talks on phone: Not on file    Gets together:  Not on file    Attends religious service: Not on file    Active member of club or organization: Not on file    Attends meetings of clubs or organizations: Not on file    Relationship status: Not on file  Other Topics Concern  . Not on file  Social History Narrative  . Not on file    Allergies:  Allergies  Allergen Reactions  . Buspirone Nausea And Vomiting and Nausea Only    And double vision.  . Azithromycin Rash    Metabolic Disorder Labs: No results found for: HGBA1C, MPG No results found for: PROLACTIN No results found for: CHOL, TRIG, HDL, CHOLHDL, VLDL, LDLCALC No results found for: TSH  Therapeutic Level Labs: No results found for: LITHIUM No results found for: VALPROATE No components found for:  CBMZ  Current Medications: Current Outpatient Medications  Medication Sig Dispense Refill  . fluticasone (FLONASE) 50 MCG/ACT nasal spray Place into both nostrils 2 (two) times daily as needed for allergies or rhinitis.    Marland Kitchen loratadine (CLARITIN) 10 MG tablet Take 10 mg by mouth daily as needed for allergies.    . Multiple Vitamins-Minerals (MULTIVITAMIN ADULT EXTRA C) CHEW Chew 1 tablet by mouth daily.    Marland Kitchen ALPRAZolam (XANAX) 0.25 MG tablet Take 1/4-1/2 tab po qd prn anxiety 15 tablet 0  . Ca Carbonate-Mag Hydroxide (ROLAIDS PO) Take 1 tablet by mouth daily as needed.    Marland Kitchen ibuprofen (ADVIL,MOTRIN) 600 MG tablet Take 1 tablet (600 mg total) by mouth every 6 (six) hours as needed. (Patient not taking: Reported on 06/21/2018) 30 tablet 0  . sertraline (ZOLOFT) 25 MG tablet Take 1/2 tablet po qd x 1 week, then 1 tablet po qd 30 tablet 1   No current facility-administered medications for this visit.    Medication Side Effects: None  Orders placed this visit:  No orders of the defined types were placed in this encounter.   Psychiatric Specialty Exam: Review of Systems  Constitutional: Negative.   HENT: Negative.   Eyes: Negative.   Respiratory: Negative.    Cardiovascular: Negative.   Gastrointestinal: Negative.   Genitourinary: Negative.   Musculoskeletal: Negative.   Skin: Negative.   Neurological: Positive for dizziness.       Feeling light headed  Endo/Heme/Allergies: Negative.   Psychiatric/Behavioral: Negative for suicidal ideas. The patient is nervous/anxious. The patient does not have insomnia.     Blood pressure 101/69, pulse 66, height 5\' 5"  (1.651 m), last menstrual period 06/17/2018, not currently breastfeeding.Body mass index is 21.63 kg/m.  General Appearance: Meticulous, Neat and Well Groomed  Eye Contact:  Good  Speech:  Clear and Coherent and Normal Rate  Volume:  Normal  Mood:  Anxious  Affect:  Anxious  Thought Process:  Coherent  Orientation:  Full (Time, Place, and Person)  Thought Content: Obsessions and Rumination   Suicidal Thoughts:  No  Homicidal Thoughts:  No  Memory:  Recent  Judgement:  Good  Insight:  Good  Psychomotor Activity:  Normal  Concentration:  Concentration: Fair  Recall:  Good  Fund of Knowledge: Good  Language: Good  Akathisia:  NA  AIMS (if indicated): not done  Assets:  Communication Skills Desire for Improvement Financial Resources/Insurance Resilience Social Support  ADL's:  Intact  Cognition: WNL  Prognosis:  Good   Screenings:  PHQ2-9     Office Visit from 08/17/2015 in [facility] at [facility] Total Score  0       Treatment Plan/Recommendations: Patient seen for 60 minutes and greater than 50% of visit spent counseling patient regarding anxiety signs and symptoms and discussing obsessive-compulsive symptoms, to include intrusive thoughts.  Discussed that treatment guidelines recommend treatment with an SSRI for obsessions and compulsions.  Discussed patient's apprehension and anxiety around taking medication and discussed using low-dose to help increase her comfort and compliance.  Discussed potential benefits, risks, and side effects of sertraline and her  questions related to medications.  Discussed starting sertraline 25 mg 1/2 tablet p.o. daily x1 week, then increasing to 25 mg 1 tablet p.o. daily for anxiety.  Encourage patient to contact office with any questions or intolerable side effects.  Encourage patient to continue therapy.  Patient to follow-up with this provider in 4 to 6 weeks or sooner if clinically indicated.   Corie Chiquito, PMHNP

## 2018-07-07 ENCOUNTER — Other Ambulatory Visit: Payer: Self-pay

## 2018-07-09 ENCOUNTER — Ambulatory Visit (INDEPENDENT_AMBULATORY_CARE_PROVIDER_SITE_OTHER): Payer: No Typology Code available for payment source | Admitting: Allergy

## 2018-07-09 ENCOUNTER — Encounter: Payer: Self-pay | Admitting: Allergy

## 2018-07-09 VITALS — BP 112/68 | HR 80 | Temp 97.9°F | Resp 16 | Ht 65.5 in | Wt 133.0 lb

## 2018-07-09 DIAGNOSIS — J4520 Mild intermittent asthma, uncomplicated: Secondary | ICD-10-CM | POA: Diagnosis not present

## 2018-07-09 DIAGNOSIS — J3089 Other allergic rhinitis: Secondary | ICD-10-CM | POA: Diagnosis not present

## 2018-07-09 DIAGNOSIS — J310 Chronic rhinitis: Secondary | ICD-10-CM

## 2018-07-09 NOTE — Assessment & Plan Note (Signed)
Diagnosed with asthma at age 27.  Used to be on Advair.  No recent inhaler use.  Today's spirometry was normal.  Monitor symptoms.

## 2018-07-09 NOTE — Patient Instructions (Addendum)
Chronic rhinitis Rhinitis symptoms in the past 10 years with worsening the last few months mainly in the fall and spring. Tried Claritin with no benefit.  Flonase 1 spray once a day seemed to help in the past.  Patient does follow with ENT.  Today's skin testing showed: negative environmental allergy panel.   Patient declined intradermal and prefers to get bloodwork as below. Will make additional recommendations based on results.   May use Flonase 1 spray twice a day as needed.  Demonstrated proper use.  May use over the counter antihistamines such as Zyrtec (cetirizine), Claritin (loratadine), Allegra (fexofenadine), or Xyzal (levocetirizine) daily as needed.   Lab Orders     IgE     Allergens w/Total IgE Area 2  Mild intermittent asthma without complication Diagnosed with asthma at age 27.  Used to be on Advair.  No recent inhaler use.  Today's spirometry was normal.  Monitor symptoms.  Return in about 4 months (around 11/09/2018).

## 2018-07-09 NOTE — Progress Notes (Signed)
New Patient Note  RE: Joan Guerra MRN: [id number] DOB: Jan 27, 1991 Date of Office Visit: 07/09/2018  Referring provider: Keturah Barre, MD Primary care provider: Iva Boop, MD  Chief Complaint: Nasal Congestion (considerable amounts of congestion. allergy testing years ago. wonders if she has been exposed to something that she is allergic to and was not aware of it. )  History of Present Illness: I had the pleasure of seeing Joan Guerra for initial evaluation at the [facility] on 07/09/2018. She is a 27 y.o. female, who is referred here by Dr. Haroldine Laws for the evaluation of nasal congestion.   Rhinitis: She reports symptoms of nasal congestion, sinus pressure, sinus infections, rhinorrhea, sneezing, itchy/watery eyes. Symptoms have been going on for 10+ years but worse the last few months The symptoms are present usually in the fall and spring. Other triggers include exposure to none. Anosmia: no. Headache: no. She has used Claritin prn with no benefit, Flonase 1 spray with some improvement in symptoms. Sinus infections: one in the past year which was treated with antibiotics with no benefit. Previous work up includes: spt at age 89 and unsure of results. Previous ENT evaluation: yes, had rhinoscopy. Previous sinus imaging: no.  Asthma history:  She reports symptoms of chest tightness, shortness of breath, coughing, wheezing for 16 years. Current medications include none. She reports not using aerochamber with asthma inhalers. She tried the following inhalers: Advair. Main asthma triggers are unknown. In the last month, frequency of asthma symptoms: 0x/week. Frequency of nocturnal symptoms: 0x/month. Frequency of SABA use: 0x/week. Interference with physical activity: no. Sleep is undisturbed. In the last 12 months, emergency room visits/urgent care visits/doctor office visits or hospitalizations due to asthma: 0. In the last 12 months, oral steroids courses: 0 Lifetime  history of hospitalization for asthma: no. Prior intubations: no. Asthma was diagnosed at age 25 by spirometry. History of pneumonia: yes. She was evaluated by allergist in the past. Smoking exposure: no. Up to date with flu vaccine: yes.   Assessment and Plan: Ziyon is a 27 y.o. female with: Chronic rhinitis Rhinitis symptoms in the past 10 years with worsening the last few months mainly in the fall and spring. Tried Claritin with no benefit.  Flonase 1 spray once a day seemed to help in the past.  Patient does follow with ENT.  Today's skin testing showed: negative environmental allergy panel.   Patient declined intradermal and prefers to get bloodwork as below. Will make additional recommendations based on results.   May use Flonase 1 spray twice a day as needed.  Demonstrated proper use.  May use over the counter antihistamines such as Zyrtec (cetirizine), Claritin (loratadine), Allegra (fexofenadine), or Xyzal (levocetirizine) daily as needed.   Lab Orders     IgE     Allergens w/Total IgE Area 2  Mild intermittent asthma without complication Diagnosed with asthma at age 25.  Used to be on Advair.  No recent inhaler use.  Today's spirometry was normal.  Monitor symptoms.  Return in about 4 months (around 11/09/2018).  No orders of the defined types were placed in this encounter.  Other allergy screening: Asthma: yes Rhino conjunctivitis: yes Food allergy: no Medication allergy: yes Hymenoptera allergy: buspirone - double vision and migraines; azithromycin - rash and vomiting Urticaria: no Eczema:no History of recurrent infections suggestive of immunodeficency: no  Yes strep throat every year.   Diagnostics: Spirometry:  Tracings reviewed. Her effort: Good reproducible efforts. FVC: 3.48 L FEV1:  3.11 L, 90 % predicted FEV1/FVC ratio: 89 % Interpretation: Spirometry consistent with normal pattern.  Please see scanned spirometry results for details.  Skin Testing:  Environmental allergy panel.  Negative test to: environmental allergies.  Declines intradermal testing and prefers bloodwork. Results discussed with patient/family. Airborne Adult Perc - 07/09/18 1445    Time Antigen Placed  0245    Allergen Manufacturer  Waynette Buttery    Location  Back    Number of Test  59    Panel 1  Select    1. Control-Buffer 50% Glycerol  Negative    2. Control-Histamine 1 mg/ml  4+    3. Albumin saline  Negative    4. Bahia  Negative    5. French Southern Territories  Negative    6. Johnson  Negative    7. Kentucky Blue  Negative    8. Meadow Fescue  Negative    9. Perennial Rye  Negative    10. Sweet Vernal  Negative    11. Timothy  Negative    12. Cocklebur  Negative    13. Burweed Marshelder  Negative    14. Ragweed, short  Negative    15. Ragweed, Giant  Negative    16. Plantain,  English  Negative    17. Lamb's Quarters  Negative    18. Sheep Sorrell  Negative    19. Rough Pigweed  Negative    20. Marsh Elder, Rough  Negative    21. Mugwort, Common  Negative    22. Ash mix  Negative    23. Birch mix  Negative    24. Beech American  Negative    25. Box, Elder  Negative    26. Cedar, red  Negative    27. Cottonwood, Guinea-Bissau  Negative    28. Elm mix  Negative    29. Hickory mix  Negative    30. Maple mix  Negative    31. Oak, Guinea-Bissau mix  Negative    32. Pecan Pollen  Negative    33. Pine mix  Negative    34. Sycamore Eastern  Negative    35. Walnut, Black Pollen  Negative    36. Alternaria alternata  Negative    37. Cladosporium Herbarum  Negative    38. Aspergillus mix  Negative    39. Penicillium mix  Negative    40. Bipolaris sorokiniana (Helminthosporium)  Negative    41. Drechslera spicifera (Curvularia)  Negative    42. Mucor plumbeus  Negative    43. Fusarium moniliforme  Negative    44. Aureobasidium pullulans (pullulara)  Negative    45. Rhizopus oryzae  Negative    46. Botrytis cinera  Negative    47. Epicoccum nigrum  Negative    48. Phoma betae   Negative    49. Candida Albicans  Negative    50. Trichophyton mentagrophytes  Negative    51. Mite, D Farinae  5,000 AU/ml  Negative    52. Mite, D Pteronyssinus  5,000 AU/ml  Negative    53. Cat Hair 10,000 BAU/ml  Negative    54.  Dog Epithelia  Negative    55. Mixed Feathers  Negative    56. Horse Epithelia  Negative    57. Cockroach, German  Negative    58. Mouse  Negative    59. Tobacco Leaf  Negative       Past Medical History: Patient Active Problem List   Diagnosis Date Noted  . Chronic rhinitis 07/09/2018  .  Mild intermittent asthma without complication 07/09/2018  . SVD (spontaneous vaginal delivery) 08/31/2017  . Periurethral laceration, delivered, current hospitalization 08/31/2017  . Normal labor 08/30/2017   Past Medical History:  Diagnosis Date  . Allergy   . Anxiety   . HSV (herpes simplex virus) anogenital infection    Past Surgical History: Past Surgical History:  Procedure Laterality Date  . CHOLECYSTECTOMY     Medication List:  Current Outpatient Medications  Medication Sig Dispense Refill  . ALPRAZolam (XANAX) 0.25 MG tablet Take 1/4-1/2 tab po qd prn anxiety 15 tablet 0  . Ca Carbonate-Mag Hydroxide (ROLAIDS PO) Take 1 tablet by mouth daily as needed.    . fluticasone (FLONASE) 50 MCG/ACT nasal spray Place into both nostrils 2 (two) times daily as needed for allergies or rhinitis.    Marland Kitchen loratadine (CLARITIN) 10 MG tablet Take 10 mg by mouth daily as needed for allergies.    . Multiple Vitamins-Minerals (MULTIVITAMIN ADULT EXTRA C) CHEW Chew 1 tablet by mouth daily.    . sertraline (ZOLOFT) 25 MG tablet Take 1/2 tablet po qd x 1 week, then 1 tablet po qd 30 tablet 1   No current facility-administered medications for this visit.    Allergies: Allergies  Allergen Reactions  . Buspirone Nausea And Vomiting and Nausea Only    And double vision.  . Azithromycin Rash   Social History: Social History   Socioeconomic History  . Marital status:  Married    Spouse name: Not on file  . Number of children: Not on file  . Years of education: Not on file  . Highest education level: Not on file  Occupational History  . Not on file  Social Needs  . Financial resource strain: Not on file  . Food insecurity:    Worry: Not on file    Inability: Not on file  . Transportation needs:    Medical: Not on file    Non-medical: Not on file  Tobacco Use  . Smoking status: Never Smoker  . Smokeless tobacco: Never Used  Substance and Sexual Activity  . Alcohol use: Yes    Alcohol/week: 2.0 standard drinks    Types: 2 Standard drinks or equivalent per week  . Drug use: No  . Sexual activity: Yes  Lifestyle  . Physical activity:    Days per week: Not on file    Minutes per session: Not on file  . Stress: Not on file  Relationships  . Social connections:    Talks on phone: Not on file    Gets together: Not on file    Attends religious service: Not on file    Active member of club or organization: Not on file    Attends meetings of clubs or organizations: Not on file    Relationship status: Not on file  Other Topics Concern  . Not on file  Social History Narrative  . Not on file   Lives in a home built in 1978. Smoking: denies Occupation: Nature conservation officer at [employer].  Environmental History: Water Damage/mildew in the house: no Carpet in the family room: no Carpet in the bedroom: no Heating: gas Cooling: central Pet: yes 2 dogs x 7 yrs and goes into bedroom.  Family History: Family History  Problem Relation Age of Onset  . Heart disease Mother   . Hyperlipidemia Mother   . Hypertension Mother   . Autoimmune disease Mother   . Hyperlipidemia Father   . Cancer Maternal Grandmother   .  Heart disease Maternal Grandmother   . Hyperlipidemia Maternal Grandmother   . Hypertension Maternal Grandmother   . Heart disease Paternal Grandmother   . Hyperlipidemia Paternal Grandmother   . Hypertension Paternal  Grandmother    Problem                               Relation Asthma                                   No  Eczema                                Mother, sister Food allergy                          No  Allergic rhino conjunctivitis     No   Review of Systems  Constitutional: Negative for appetite change, chills, fever and unexpected weight change.  HENT: Positive for congestion and rhinorrhea.   Eyes: Positive for itching.  Respiratory: Negative for cough, chest tightness, shortness of breath and wheezing.   Cardiovascular: Negative for chest pain.  Gastrointestinal: Negative for abdominal pain.  Genitourinary: Negative for difficulty urinating.  Skin: Negative for rash.  Allergic/Immunologic: Positive for environmental allergies. Negative for food allergies.  Neurological: Negative for headaches.   Objective: BP 112/68 (BP Location: Left Arm, Patient Position: Sitting, Cuff Size: Normal)   Pulse 80   Temp 97.9 F (36.6 C) (Oral)   Resp 16   Ht 5' 5.5" (1.664 m)   Wt 133 lb (60.3 kg)   LMP 06/17/2018 (Exact Date)   SpO2 98%   BMI 21.80 kg/m  Body mass index is 21.8 kg/m. Physical Exam  Constitutional: She is oriented to person, place, and time. She appears well-developed and well-nourished.  HENT:  Head: Normocephalic and atraumatic.  Right Ear: External ear normal.  Left Ear: External ear normal.  Nose: Nose normal.  Mouth/Throat: Oropharynx is clear and moist.  Eyes: Conjunctivae and EOM are normal.  Neck: Neck supple.  Cardiovascular: Normal rate, regular rhythm and normal heart sounds. Exam reveals no gallop and no friction rub.  No murmur heard. Pulmonary/Chest: Effort normal and breath sounds normal. She has no wheezes. She has no rales.  Abdominal: Soft. Bowel sounds are normal. There is no tenderness.  Lymphadenopathy:    She has no cervical adenopathy.  Neurological: She is alert and oriented to person, place, and time.  Skin: Skin is warm. No rash noted.    Psychiatric: She has a normal mood and affect. Her behavior is normal.  Nursing note and vitals reviewed.  The plan was reviewed with the patient/family, and all questions/concerned were addressed.  It was my pleasure to see Sacoya today and participate in her care. Please feel free to contact me with any questions or concerns.  Sincerely,  Wyline Mood, DO Allergy & Immunology  [facility] office: [phone] [facility] office:[phone]

## 2018-07-09 NOTE — Assessment & Plan Note (Addendum)
Rhinitis symptoms in the past 10 years with worsening the last few months mainly in the fall and spring. Tried Claritin with no benefit.  Flonase 1 spray once a day seemed to help in the past.  Patient does follow with ENT.  Today's skin testing showed: negative environmental allergy panel.   Patient declined intradermal and prefers to get bloodwork as below. Will make additional recommendations based on results.   May use Flonase 1 spray twice a day as needed.  Demonstrated proper use.  May use over the counter antihistamines such as Zyrtec (cetirizine), Claritin (loratadine), Allegra (fexofenadine), or Xyzal (levocetirizine) daily as needed.   Lab Orders     IgE     Allergens w/Total IgE Area 2

## 2018-07-10 ENCOUNTER — Ambulatory Visit: Payer: Self-pay | Admitting: Mental Health

## 2018-07-12 LAB — ALLERGENS W/TOTAL IGE AREA 2

## 2018-07-16 ENCOUNTER — Encounter: Payer: Self-pay | Admitting: Allergy

## 2018-07-18 ENCOUNTER — Ambulatory Visit: Payer: No Typology Code available for payment source | Admitting: Mental Health

## 2018-07-18 DIAGNOSIS — F411 Generalized anxiety disorder: Secondary | ICD-10-CM | POA: Diagnosis not present

## 2018-07-18 DIAGNOSIS — F422 Mixed obsessional thoughts and acts: Secondary | ICD-10-CM

## 2018-07-18 NOTE — Progress Notes (Signed)
      [facility] Counselor/Therapist Progress Note   Patient ID: Joan Guerra, MRN: [id number]  Date: 07/18/2018  Timespent: 45 minutes   Treatment Type: Individual   Reported Symptoms: Obsessive thinking, Sleep disturbance and anxiety   Mental Status Exam:    Appearance:   Well Groomed     Behavior:  Appropriate and Sharing  Motor:  Normal  Speech/Language:   Clear and Coherent  Affect:  Appropriate  Mood:  anxious and euthymic  Thought process:  normal  Thought content:    WNL  Sensory/Perceptual disturbances:    WNL  Orientation:  oriented to person, place, time/date and situation  Attention:  Good  Concentration:  Good  Memory:  WNL  Fund of knowledge:   Good  Insight:    Good  Judgment:   Good  Impulse Control:  Good     Risk Assessment: Danger to Self:  No Self-injurious Behavior: No Danger to Others: No Duty to Warn:no Physical Aggression / Violence:No  Access to Firearms a concern: No  Gang Involvement:No    Subjective:  Improved level of anxiety and obsessive thoughts and rituals with 12.5 mg. zoloft start-up dose from [facility]. Feeling much happier, calmer and more peaceful.    Interventions: Cognitive Behavioral Therapy   Diagnosis:   ICD-10-CM   1. Generalized anxiety disorder F41.1   2. Mixed obsessional thoughts and acts F42.2      Plan:   Mindful self care program             Decrease in obsessions by rational thinking             Self-comforting techniques             Experience increased enjoyment of life without fear   [name], [facility]

## 2018-07-24 ENCOUNTER — Encounter: Payer: Self-pay | Admitting: Emergency Medicine

## 2018-07-24 DIAGNOSIS — F411 Generalized anxiety disorder: Secondary | ICD-10-CM | POA: Insufficient documentation

## 2018-08-07 ENCOUNTER — Encounter: Payer: Self-pay | Admitting: Psychiatry

## 2018-08-07 ENCOUNTER — Ambulatory Visit: Payer: No Typology Code available for payment source | Admitting: Psychiatry

## 2018-08-07 VITALS — BP 124/71 | HR 71

## 2018-08-07 DIAGNOSIS — F411 Generalized anxiety disorder: Secondary | ICD-10-CM | POA: Diagnosis not present

## 2018-08-07 DIAGNOSIS — F422 Mixed obsessional thoughts and acts: Secondary | ICD-10-CM

## 2018-08-07 MED ORDER — SERTRALINE HCL 25 MG PO TABS: ORAL_TABLET | ORAL | 0 refills | Status: DC

## 2018-08-07 MED FILL — SERTRALINE HCL 25 MG TABLET: 25 | 90 days supply | Qty: 90 | Fill #0

## 2018-08-07 NOTE — Progress Notes (Signed)
Joan Guerra [id number] [birth year] 27 y.o.  Subjective:   Patient ID:  Joan Guerra is a 27 y.o. (DOB Jul 09, 1991) female.  Chief Complaint:  Chief Complaint  Patient presents with  . Anxiety    HPI [name] presents to the office today for follow-up of anxiety. She reports that she was initially anxious about starting medication and waited several days before starting it. She reports that "within maybe a week my obsessions were better." She reports that she then stayed on Sertraline 25 mg 1/2 tab po qd. She reports that she took 1 tab for 3-4 days and felt "spacey" and "out of it." She then backed it down to 1/2 tab and had a HA and then side effects resolved.   She reports that she no longer becomes fixated on things and is "able to let it go." Reports that she notices that compulsions have decreased and she is now better able to control compulsions. Reports that rumination has resolved and would previously ruminate on negative feedback for hours or days. Now recognizing that she overanalyze everything. Denies panic attacks. Notices some muscle tension in her neck. She reports that she is no longer having intrusive thoughts that a physical complaint could be a serious or life threatening illness.   Notices that she feels "off" periodically and notices lower motivation and energy, along with lower mood and feeling "blah." Reports that this seems to occur around ovulation. Reports prior to Sertraline that she would have periods of depersonalization along with these s/s.   She reports that her mood is "much better" and longer as easily agitated by things. Reports that it is "much harder to wake up in the morning" since starting Sertraline. Reports sleeping about 8 hours a night in the morning. "Overall I feel great." She reports improved energy and "no longer crashing in the afternoons." Reports that appetite is ok and now responding better to hunger cues and eating regularly. She reports that she  feels less distracted now that anxious thoughts have decreased. She reports that she has occasional word finding errors. Reports that she is enjoying things more. Denies SI.   Review of Systems:  Review of Systems  HENT:       Reports slight eye changes, and describes feeling as if eyes were dilated  Musculoskeletal: Negative for gait problem.  Neurological: Negative for tremors.  Psychiatric/Behavioral:       Please refer to HPI    Medications: I have reviewed the patient's current medications.  Current Outpatient Medications  Medication Sig Dispense Refill  . Ca Carbonate-Mag Hydroxide (ROLAIDS PO) Take 1 tablet by mouth daily as needed.    . fluticasone (FLONASE) 50 MCG/ACT nasal spray Place into both nostrils 2 (two) times daily as needed for allergies or rhinitis.    Marland Kitchen loratadine (CLARITIN) 10 MG tablet Take 10 mg by mouth daily as needed for allergies.    . Multiple Vitamins-Minerals (MULTIVITAMIN ADULT EXTRA C) CHEW Chew 1 tablet by mouth daily.    . sertraline (ZOLOFT) 25 MG tablet Take 1/2 tablet po qd x 1 week, then 1 tablet po qd (Patient taking differently: 12.5 mg. Take 1/2 tablet po qd x 1 week, then 1 tablet po qd) 30 tablet 1  . ALPRAZolam (XANAX) 0.25 MG tablet Take 1/4-1/2 tab po qd prn anxiety (Patient not taking: Reported on 08/07/2018) 15 tablet 0  . sertraline (ZOLOFT) 25 MG tablet Take 1/2-1 tablet po qd 90 tablet 0   No current facility-administered medications for this  visit.     Medication Side Effects: Other: Reports that she has had mild changes in her eyes/vision as if her eyes have been dilated.  Allergies:  Allergies  Allergen Reactions  . Buspirone Nausea And Vomiting and Nausea Only    And double vision.  . Azithromycin Rash    Past Medical History:  Diagnosis Date  . Allergy   . Anxiety   . HSV (herpes simplex virus) anogenital infection     Family History  Problem Relation Age of Onset  . Heart disease Mother   . Hyperlipidemia Mother    . Hypertension Mother   . Autoimmune disease Mother   . Hyperlipidemia Father   . Cancer Maternal Grandmother   . Heart disease Maternal Grandmother   . Hyperlipidemia Maternal Grandmother   . Hypertension Maternal Grandmother   . Heart disease Paternal Grandmother   . Hyperlipidemia Paternal Grandmother   . Hypertension Paternal Grandmother     Social History   Socioeconomic History  . Marital status: Married    Spouse name: Not on file  . Number of children: Not on file  . Years of education: Not on file  . Highest education level: Not on file  Occupational History  . Not on file  Social Needs  . Financial resource strain: Not on file  . Food insecurity:    Worry: Not on file    Inability: Not on file  . Transportation needs:    Medical: Not on file    Non-medical: Not on file  Tobacco Use  . Smoking status: Never Smoker  . Smokeless tobacco: Never Used  Substance and Sexual Activity  . Alcohol use: Yes    Alcohol/week: 2.0 standard drinks    Types: 2 Standard drinks or equivalent per week  . Drug use: No  . Sexual activity: Yes  Lifestyle  . Physical activity:    Days per week: Not on file    Minutes per session: Not on file  . Stress: Not on file  Relationships  . Social connections:    Talks on phone: Not on file    Gets together: Not on file    Attends religious service: Not on file    Active member of club or organization: Not on file    Attends meetings of clubs or organizations: Not on file    Relationship status: Not on file  . Intimate partner violence:    Fear of current or ex partner: Not on file    Emotionally abused: Not on file    Physically abused: Not on file    Forced sexual activity: Not on file  Other Topics Concern  . Not on file  Social History Narrative  . Not on file    Past Medical History, Surgical history, Social history, and Family history were reviewed and updated as appropriate.   Please see review of systems for  further details on the patient's review from today.   Objective:   Physical Exam:  BP 124/71   Pulse 71   Physical Exam  Constitutional: She is oriented to person, place, and time. She appears well-developed. No distress.  Musculoskeletal: She exhibits no deformity.  Neurological: She is alert and oriented to person, place, and time. Coordination normal.  Psychiatric: She has a normal mood and affect. Her speech is normal and behavior is normal. Judgment and thought content normal. Her mood appears not anxious. Her affect is not angry, not blunt, not labile and not inappropriate. Cognition and memory  are normal. She does not exhibit a depressed mood. She expresses no homicidal and no suicidal ideation. She expresses no suicidal plans and no homicidal plans.  Insight intact. No auditory or visual hallucinations. No delusions.     Lab Review:     Component Value Date/Time   NA 140 06/21/2018 0852   K 3.5 06/21/2018 0852   CL 104 06/21/2018 0852   CO2 22 06/21/2018 0852   GLUCOSE 88 06/21/2018 0852   BUN 10 06/21/2018 0852   CREATININE 0.63 06/21/2018 0852   CALCIUM 9.4 06/21/2018 0852   PROT 7.3 06/21/2018 0852   ALBUMIN 4.4 06/21/2018 0852   AST 29 06/21/2018 0852   ALT 22 06/21/2018 0852   ALKPHOS 67 06/21/2018 0852   BILITOT 1.0 06/21/2018 0852   GFRNONAA >60 06/21/2018 0852   GFRAA >60 06/21/2018 0852       Component Value Date/Time   WBC 7.9 06/21/2018 0852   RBC 5.01 06/21/2018 0852   HGB 15.8 (H) 06/21/2018 0852   HCT 46.2 (H) 06/21/2018 0852   PLT 338 06/21/2018 0852   MCV 92.2 06/21/2018 0852   MCH 31.5 06/21/2018 0852   MCHC 34.2 06/21/2018 0852   RDW 11.9 06/21/2018 0852   LYMPHSABS 1.7 08/31/2017 0056   MONOABS 1.2 (H) 08/31/2017 0056   EOSABS 0.0 08/31/2017 0056   BASOSABS 0.0 08/31/2017 0056    No results found for: POCLITH, LITHIUM   No results found for: PHENYTOIN, PHENOBARB, VALPROATE, CBMZ   .res Assessment: Plan:   Patient seen for 30  minutes and greater than 50% of visit spent counseling patient and answering her questions related to long-term risk of sertraline, risk of combining sertraline with alcohol, and held to minimize discontinuation signs and symptoms if she were to discontinue sertraline in the future.  Patient reports that sertraline 12.5 mg daily is currently effectively treating her anxiety with good tolerability. Will therefore continue Sertraline 12.5 mg po qd for anxiety. Recommend continuing therapy with Ulice Boldarson Sarvis, LPC. Pt to f/u with this provider in 3 months or sooner if clinically indicated.   Generalized anxiety disorder - Plan: sertraline (ZOLOFT) 25 MG tablet  Mixed obsessional thoughts and acts - Plan: sertraline (ZOLOFT) 25 MG tablet  Please see After Visit Summary for patient specific instructions.  Future Appointments  Date Time Provider Department Center  11/07/2018  8:00 AM Corie Chiquitoarter, Makyle Eslick, PMHNP [facility] None    No orders of the defined types were placed in this encounter.     -------------------------------

## 2018-08-26 MED FILL — CIPROFLOXACIN HCL 250 MG TA: 250 | 3 days supply | Qty: 6 | Fill #0

## 2018-08-27 MED FILL — valACYclovir HCL 1 GM TABS: 1 | 2 days supply | Qty: 4 | Fill #0

## 2018-10-22 ENCOUNTER — Encounter: Payer: Self-pay | Admitting: Family Medicine

## 2018-10-22 ENCOUNTER — Ambulatory Visit (INDEPENDENT_AMBULATORY_CARE_PROVIDER_SITE_OTHER): Payer: Self-pay | Admitting: Family Medicine

## 2018-10-22 VITALS — BP 95/70 | HR 91 | Temp 98.5°F | Resp 16 | Wt 136.6 lb

## 2018-10-22 DIAGNOSIS — N949 Unspecified condition associated with female genital organs and menstrual cycle: Secondary | ICD-10-CM

## 2018-10-22 NOTE — Progress Notes (Signed)
Joan Guerra is a 28 y.o. female who presents today with many  days of symptoms after multiple attempted treatments of vaginal discharge, itching and urinary symptoms. This condition was originally treated in December with out a culture with cefotaxime (she completed all but the last tablet and reports some muscle pain and feeling not well on the medication) per patient and then symptoms returned the past week and she was treated via video visit with Bactrim for UTI as well as self treated for vaginal yeast with fluconazole left over from previous encounter. She today presents with concerns related to mounting vaginal symptoms that are diverse and create a wide differential.     Review of Systems  Constitutional: Negative for chills, fever and malaise/fatigue.  HENT: Negative for congestion, ear discharge, ear pain, sinus pain and sore throat.   Eyes: Negative.   Respiratory: Negative for cough, sputum production and shortness of breath.   Cardiovascular: Negative.  Negative for chest pain.  Gastrointestinal: Negative for abdominal pain, blood in stool, constipation, diarrhea, nausea and vomiting.  Genitourinary: Positive for dysuria, frequency, hematuria and urgency.       Vaginal discharge, vaginal itching, denies odor.  Musculoskeletal: Negative for myalgias.  Skin: Negative.   Neurological: Negative for headaches.  Endo/Heme/Allergies: Negative.   Psychiatric/Behavioral: Negative.     Joan Guerra has a current medication list which includes the following prescription(s): fluconazole, fluticasone, loratadine, multivitamin adult extra c, sertraline, sulfamethoxazole-trimethoprim, valacyclovir, and sertraline. Also is allergic to buspirone and azithromycin.  Joan Guerra  has a past medical history of Allergy, Anxiety, and HSV (herpes simplex virus) anogenital infection. Also  has a past surgical history that includes Cholecystectomy.    O: Vitals:   10/22/18 1438  BP: 95/70  Pulse: 91  Resp: 16   Temp: 98.5 F (36.9 C)  SpO2: 99%     Physical Exam   A: 1. Vaginal symptom      P: 1. Vaginal symptom Advised to seek comprehensive evaluation with PCP's after hours clinic. [facility]- due to recent treatment x 2 in the last 60 days and then co-comittent treatment with valtrex and fluconazole- unsure what etiology is presenting and this is important to proper dx and treatment of symptoms. Discussed benefit of vaginal exam (wet prep) and urine culture. No physical exam completed. Patient well appearing in NAD on interview.  Other orders - sulfamethoxazole-trimethoprim (BACTRIM DS,SEPTRA DS) 800-160 MG tablet - valACYclovir (VALTREX) 1000 MG tablet - fluconazole (DIFLUCAN) 100 MG tablet; Take 100 mg by mouth daily.   Discussed with patient exam findings, suspected diagnosis etiology and  reviewed recommended treatment plan and follow up, including complications and indications for urgent medical follow up and evaluation. Medications including use and indications reviewed with patient. Patient provided relevant patient education on diagnosis and/or relevant related condition that were discussed and reviewed with patient at discharge. Patient verbalized understanding of information provided and agrees with plan of care (POC), all questions answered.

## 2018-10-22 NOTE — Patient Instructions (Addendum)
Follow up with PCP today in after hours clinic as discussed.

## 2018-10-23 MED FILL — FLUCONAZOLE 150 MG TABS: 150 | 2 days supply | Qty: 2 | Fill #0

## 2018-10-29 ENCOUNTER — Encounter: Payer: No Typology Code available for payment source | Admitting: Obstetrics and Gynecology

## 2018-11-04 ENCOUNTER — Ambulatory Visit (INDEPENDENT_AMBULATORY_CARE_PROVIDER_SITE_OTHER): Payer: Self-pay | Admitting: Nurse Practitioner

## 2018-11-04 VITALS — BP 95/72 | HR 72 | Temp 98.6°F | Wt 135.0 lb

## 2018-11-04 DIAGNOSIS — J029 Acute pharyngitis, unspecified: Secondary | ICD-10-CM

## 2018-11-04 DIAGNOSIS — J069 Acute upper respiratory infection, unspecified: Secondary | ICD-10-CM

## 2018-11-04 LAB — POCT RAPID STREP A (OFFICE): Rapid Strep A Screen: NEGATIVE

## 2018-11-04 MED ORDER — FLUTICASONE PROPIONATE 50 MCG/ACT NA SUSP: 2.0000 | Freq: Every day | NASAL | 0 refills | Status: DC

## 2018-11-04 MED ORDER — LIDOCAINE VISCOUS HCL 2 % MT SOLN: 5.0000 mL | Freq: Four times a day (QID) | OROMUCOSAL | 0 refills | Status: AC | PRN

## 2018-11-04 NOTE — Progress Notes (Signed)
Subjective:     Joan Guerra is a 28 y.o. female who presents for evaluation of sore throat. Associated symptoms include nasal blockage, post nasal drip, sinus and nasal congestion, sore throat, swollen glands, Gervacio spots in throat and no fever..  The patient further denies chills, sinus pain, sinus pressure, cough, wheezing, shortness of breath, difficulty breathing.  Patient also denies drooling, hot potato voice, difficulty speaking or difficulty swallowing.  Patient rates current throat pain six 6/10 at present.  Onset of symptoms was 3 days ago, and have been gradually worsening since that time. She is drinking plenty of fluids. She has not had a recent close exposure to someone with proven streptococcal pharyngitis. Patient is not breastfeeding at this time. Patient does have a history of intermittent asthma, but does not have any asthma symptoms at this time.  The following portions of the patient's history were reviewed and updated as appropriate: allergies, current medications and past medical history.  Past Medical History:  Diagnosis Date  . Allergy   . Anxiety   . HSV (herpes simplex virus) anogenital infection     Review of Systems Constitutional: positive for fatigue, negative for anorexia, chills, fevers, malaise and sweats Eyes: negative Ears, nose, mouth, throat, and face: positive for nasal congestion, sore throat and bilateral ear pressure, negative for ear drainage, earaches and hoarseness Respiratory: positive for cough, negative for asthma, chronic bronchitis, dyspnea on exertion, sputum, stridor and wheezing Cardiovascular: negative Gastrointestinal: negative Neurological: negative    Objective:    BP 95/72 (BP Location: Right Arm, Patient Position: Sitting, Cuff Size: Normal)   Pulse 72   Temp 98.6 F (37 C) (Oral)   Wt 135 lb (61.2 kg)   LMP 10/07/2018 (Exact Date)   SpO2 99%   BMI 22.12 kg/m  Physical Exam Vitals signs reviewed.  Constitutional:       General: She is not in acute distress. HENT:     Head: Normocephalic.     Right Ear: Ear canal and external ear normal. A middle ear effusion is present.     Left Ear: Hearing, tympanic membrane and ear canal normal.     Nose: Mucosal edema, congestion ( mild) and rhinorrhea (clear drainage) present.     Right Turbinates: Enlarged.     Right Sinus: No maxillary sinus tenderness or frontal sinus tenderness.     Left Sinus: No maxillary sinus tenderness or frontal sinus tenderness.     Mouth/Throat:     Lips: Pink.     Mouth: Mucous membranes are moist.     Pharynx: Uvula midline. Pharyngeal swelling, oropharyngeal exudate and posterior oropharyngeal erythema present. No uvula swelling.     Tonsils: Swelling: 1+ on the right. 1+ on the left.  Eyes:     Conjunctiva/sclera: Conjunctivae normal.     Pupils: Pupils are equal, round, and reactive to light.  Neurological:     Mental Status: She is alert.    Laboratory Strep test done. Results:negative.    Assessment:   Acute Viral Upper Respiratory Infection  Plan:   Exam findings, diagnosis etiology and medication use and indications reviewed with patient. Follow- Up and discharge instructions provided. No emergent/urgent issues found on exam.  Based on the patient's clinical presentation, symptoms, physical assessment, and negative strep test, patient's findings are congruent with that of viral etiology.  Patient does not display high fever, purulent nasal drainage, body aches, malaise, or productive cough.  Patient informs that she feels like her throat pain improves during the  day and worsens at night, which is indicative of a virus.  Will provide the patient with symptomatic treatment to include fluticasone steroid nasal spray to help with postnasal drip which I feel may be irritating her throat, and lidocaine mouthwash to help with her throat pain.  Patient is well-appearing and is in no acute distress, with vitals being stable.  Lungs  are CTAB.  Discussed with patient how viral processes work, and explained that hopefully within the next 7 to 10 days she should be feeling much better.  However, if symptoms do not improve or persist, she has been instructed to return to our office.  Patient education was provided. Patient verbalized understanding of information provided and agrees with plan of care (POC), all questions answered. The patient is advised to call or return to clinic if condition does not see an improvement in symptoms, or to seek the care of the closest emergency department if condition worsens with the above plan.   1. Sore throat  - POCT rapid strep A  2. Viral upper respiratory tract infection  - lidocaine (XYLOCAINE) 2 % solution; Use as directed 5 mLs in the mouth or throat every 6 (six) hours as needed for up to 5 days for mouth pain.  Dispense: 100 mL; Refill: 0 - fluticasone (FLONASE) 50 MCG/ACT nasal spray; Place 2 sprays into both nostrils daily for 10 days.  Dispense: 16 g; Refill: 0 -Take medication as prescribed. -Ibuprofen or Tylenol for pain, fever, or general discomfort. -Increase fluids. -Sleep elevated on at least 2 pillows at bedtime to help with cough. -Use a humidifier or vaporizer when at home and during sleep to help with cough. -Purchase OTC Delsym cough medicine if needed.  Use as directed. -May use a teaspoon of honey or over-the-counter cough drops to help with cough. -Warm saltwater gargles 3-4 times daily until symptoms improve. -Follow-up if symptoms do not improve.

## 2018-11-04 NOTE — Patient Instructions (Signed)
Viral Respiratory Infection -Take medication as prescribed. -Ibuprofen or Tylenol for pain, fever, or general discomfort. -Increase fluids. -Sleep elevated on at least 2 pillows at bedtime to help with cough. -Use a humidifier or vaporizer when at home and during sleep to help with cough. -Purchase OTC Delsym cough medicine if needed.  Use as directed. -May use a teaspoon of honey or over-the-counter cough drops to help with cough. -Warm saltwater gargles 3-4 times daily until symptoms improve. -Follow-up if symptoms do not improve.  A respiratory infection is an illness that affects part of the respiratory system, such as the lungs, nose, or throat. A respiratory infection that is caused by a virus is called a viral respiratory infection. Common types of viral respiratory infections include:  A cold.  The flu (influenza).  A respiratory syncytial virus (RSV) infection. What are the causes? This condition is caused by a virus. What are the signs or symptoms? Symptoms of this condition include:  A stuffy or runny nose.  Yellow or green nasal discharge.  A cough.  Sneezing.  Fatigue.  Achy muscles.  A sore throat.  Sweating or chills.  A fever.  A headache. How is this diagnosed? This condition may be diagnosed based on:  Your symptoms.  A physical exam.  Testing of nasal swabs. How is this treated? This condition may be treated with medicines, such as:  Antiviral medicine. This may shorten the length of time a person has symptoms.  Expectorants. These make it easier to cough up mucus.  Decongestant nasal sprays.  Acetaminophen or NSAIDs to relieve fever and pain. Antibiotic medicines are not prescribed for viral infections. This is because antibiotics are designed to kill bacteria. They are not effective against viruses. Follow these instructions at home:  Managing pain and congestion  Take over-the-counter and prescription medicines only as told by  your health care provider.  If you have a sore throat, gargle with a salt-water mixture 3-4 times a day or as needed. To make a salt-water mixture, completely dissolve -1 tsp of salt in 1 cup of warm water.  Use nose drops made from salt water to ease congestion and soften raw skin around your nose.  Drink enough fluid to keep your urine pale yellow. This helps prevent dehydration and helps loosen up mucus. General instructions  Rest as much as possible.  Do not drink alcohol.  Do not use any products that contain nicotine or tobacco, such as cigarettes and e-cigarettes. If you need help quitting, ask your health care provider.  Keep all follow-up visits as told by your health care provider. This is important. How is this prevented?   Get an annual flu shot. You may get the flu shot in late summer, fall, or winter. Ask your health care provider when you should get your flu shot.  Avoid exposing others to your respiratory infection. ? Stay home from work or school as told by your health care provider. ? Wash your hands with soap and water often, especially after you cough or sneeze. If soap and water are not available, use alcohol-based hand sanitizer.  Avoid contact with people who are sick during cold and flu season. This is generally fall and winter. Contact a health care provider if:  Your symptoms last for 10 days or longer.  Your symptoms get worse over time.  You have a fever.  You have severe sinus pain in your face or forehead.  The glands in your jaw or neck become very swollen.  Get help right away if you:  Feel pain or pressure in your chest.  Have shortness of breath.  Faint or feel like you will faint.  Have severe and persistent vomiting.  Feel confused or disoriented. Summary  A respiratory infection is an illness that affects part of the respiratory system, such as the lungs, nose, or throat. A respiratory infection that is caused by a virus is called  a viral respiratory infection.  Common types of viral respiratory infections are a cold, influenza, and respiratory syncytial virus (RSV) infection.  Symptoms of this condition include a stuffy or runny nose, cough, sneezing, fatigue, achy muscles, sore throat, and fevers or chills.  Antibiotic medicines are not prescribed for viral infections. This is because antibiotics are designed to kill bacteria. They are not effective against viruses. This information is not intended to replace advice given to you by your health care provider. Make sure you discuss any questions you have with your health care provider. Document Released: 06/13/2005 Document Revised: 10/14/2017 Document Reviewed: 10/14/2017 Elsevier Interactive Patient Education  2019 [employer].

## 2018-11-05 ENCOUNTER — Encounter: Payer: Self-pay | Admitting: Obstetrics and Gynecology

## 2018-11-05 ENCOUNTER — Ambulatory Visit (INDEPENDENT_AMBULATORY_CARE_PROVIDER_SITE_OTHER): Payer: No Typology Code available for payment source | Admitting: Obstetrics and Gynecology

## 2018-11-05 VITALS — BP 99/59 | HR 68 | Resp 16 | Ht 65.0 in | Wt 137.0 lb

## 2018-11-05 DIAGNOSIS — Z01419 Encounter for gynecological examination (general) (routine) without abnormal findings: Secondary | ICD-10-CM | POA: Diagnosis not present

## 2018-11-05 DIAGNOSIS — Z803 Family history of malignant neoplasm of breast: Secondary | ICD-10-CM | POA: Insufficient documentation

## 2018-11-05 DIAGNOSIS — J029 Acute pharyngitis, unspecified: Secondary | ICD-10-CM | POA: Insufficient documentation

## 2018-11-05 NOTE — Progress Notes (Signed)
GYNECOLOGY ANNUAL PREVENTATIVE CARE ENCOUNTER NOTE  Subjective:   Joan Guerra is a 28 y.o. G21P1001 female here for a routine annual gynecologic exam.  Current complaints: sore throat. She was seen at urgent care yesterday and rapid swab was negative.   Denies abnormal vaginal bleeding, discharge, pelvic pain, problems with intercourse or other gynecologic concerns.    Gynecologic History Patient's last menstrual period was 11/01/2018. Contraception: none desires pregnancy in the next year.  Last Pap: 02/14/2017 Results were: normal with negative HPV Last mammogram: NA  Obstetric History OB History  Gravida Para Term Preterm AB Living  1 1 1     1   SAB TAB Ectopic Multiple Live Births        0 1    # Outcome Date GA Lbr Len/2nd Weight Sex Delivery Anes PTL Lv  1 Term 08/31/17 [redacted]w[redacted]d 14:10 / 02:27 7 lb 15.5 oz (3.615 kg) F Vag-Spont EPI  LIV    Past Medical History:  Diagnosis Date  . Allergy   . Anxiety   . HSV (herpes simplex virus) anogenital infection    cold sores not genital    Past Surgical History:  Procedure Laterality Date  . CHOLECYSTECTOMY      Current Outpatient Medications on File Prior to Visit  Medication Sig Dispense Refill  . fluticasone (FLONASE) 50 MCG/ACT nasal spray Place 2 sprays into both nostrils daily for 10 days. 16 g 0  . lidocaine (XYLOCAINE) 2 % solution Use as directed 5 mLs in the mouth or throat every 6 (six) hours as needed for up to 5 days for mouth pain. 100 mL 0  . loratadine (CLARITIN) 10 MG tablet Take 10 mg by mouth daily as needed for allergies.    . Multiple Vitamins-Minerals (MULTIVITAMIN ADULT EXTRA C) CHEW Chew 1 tablet by mouth daily.    . sertraline (ZOLOFT) 25 MG tablet Take 1/2-1 tablet po qd 90 tablet 0  . valACYclovir (VALTREX) 1000 MG tablet      No current facility-administered medications on file prior to visit.     Allergies  Allergen Reactions  . Buspirone Nausea And Vomiting and Nausea Only    And double  vision.  . Azithromycin Rash    Social History:  reports that she has never smoked. She has never used smokeless tobacco. She reports current alcohol use of about 2.0 standard drinks of alcohol per week. She reports that she does not use drugs.  Family History  Problem Relation Age of Onset  . Heart disease Mother   . Hyperlipidemia Mother   . Hypertension Mother   . Autoimmune disease Mother   . Hyperlipidemia Father   . Cancer Maternal Grandmother   . Heart disease Maternal Grandmother   . Hyperlipidemia Maternal Grandmother   . Hypertension Maternal Grandmother   . Heart disease Paternal Grandmother   . Hyperlipidemia Paternal Grandmother   . Hypertension Paternal Grandmother     The following portions of the patient's history were reviewed and updated as appropriate: allergies, current medications, past family history, past medical history, past social history, past surgical history and problem list.  Review of Systems Pertinent items noted in HPI and remainder of comprehensive ROS otherwise negative.   Objective:  BP (!) 99/59   Pulse 68   Resp 16   Ht 5\' 5"  (1.651 m)   Wt 137 lb (62.1 kg)   LMP 11/01/2018   BMI 22.80 kg/m  CONSTITUTIONAL: Well-developed, well-nourished female in no acute distress.  HENT:  Normocephalic, atraumatic, External right and left ear normal. Oropharynx with erythema, no exudate.  EYES: Conjunctivae and EOM are normal. Pupils are equal, round, and reactive to light. No scleral icterus.  NECK: Normal range of motion, supple, no masses.  Normal thyroid.  SKIN: Skin is warm and dry. No rash noted. Not diaphoretic. No erythema. No pallor. MUSCULOSKELETAL: Normal range of motion. No tenderness.  No cyanosis, clubbing, or edema.  2+ distal pulses. NEUROLOGIC: Alert and oriented to person, place, and time. Normal reflexes, muscle tone coordination. No cranial nerve deficit noted. PSYCHIATRIC: Normal mood and affect. Normal behavior. Normal judgment  and thought content. CARDIOVASCULAR: Normal heart rate noted, regular rhythm RESPIRATORY: Clear to auscultation bilaterally. Effort and breath sounds normal, no problems with respiration noted. BREASTS: Symmetric in size. No masses, skin changes, nipple drainage, or lymphadenopathy. ABDOMEN: Soft, normal bowel sounds, no distention noted.  No tenderness, rebound or guarding.   Assessment and Plan:   1. Women's annual routine gynecological examination  - Vitamin D (25 hydroxy)  2. Acute sore throat  - Culture, Group A Strep  3. Family history of breast cancer  Maternal grandmother. Screening mammogram at age 77  Routine preventative health maintenance measures emphasized. Please refer to After Visit Summary for other counseling recommendations.     Biochemist, clinical for [employer], [facility]

## 2018-11-06 ENCOUNTER — Telehealth: Payer: Self-pay

## 2018-11-06 ENCOUNTER — Telehealth: Payer: Self-pay | Admitting: Obstetrics and Gynecology

## 2018-11-06 LAB — VITAMIN D 25 HYDROXY (VIT D DEFICIENCY, FRACTURES): Vit D, 25-Hydroxy: 37 ng/mL (ref 30–100)

## 2018-11-06 MED ORDER — PENICILLIN V POTASSIUM 500 MG PO TABS: 500.0000 mg | ORAL_TABLET | Freq: Two times a day (BID) | ORAL | 0 refills | Status: AC

## 2018-11-06 MED FILL — PENICILLIN VK 500 MG TABLET: 500 | 10 days supply | Qty: 20 | Fill #0

## 2018-11-06 NOTE — Telephone Encounter (Signed)
Tried calling to get an update from pt on how she was feeling since her visit with [place], pt vm box is full so I was unable to leave message.

## 2018-11-06 NOTE — Telephone Encounter (Signed)
Joan Guerra was seen in the office yesterday for an annual. She has had sore throat for a few days. Strep PCR was negative, and culture is positive. She called the office stating that her throat was very sore and it is getting worse. She overall does not feel well. +erythema noted on exam. Culture is pending. She has Rx for viscous lidocaine. Rx for Pen V sent to pharmacy. If culture is negative she can stop the antibiotics.    Duane Lope, NP 11/06/2018 8:31 AM

## 2018-11-07 ENCOUNTER — Ambulatory Visit: Payer: No Typology Code available for payment source | Admitting: Psychiatry

## 2018-11-07 ENCOUNTER — Telehealth: Payer: Self-pay | Admitting: *Deleted

## 2018-11-07 LAB — VITAMIN D 25 HYDROXY (VIT D DEFICIENCY, FRACTURES)

## 2018-11-07 LAB — CULTURE, GROUP A STREP
MICRO NUMBER:: 215135
SPECIMEN QUALITY:: ADEQUATE

## 2018-11-07 NOTE — Telephone Encounter (Signed)
Pt notified of neg throat culture and recommended that she stop the antibiotic.  Per patient she never picked it up anyway.

## 2018-11-07 NOTE — Telephone Encounter (Signed)
-----   Message from Duane Lope, NP sent at 11/07/2018  7:50 AM EST ----- Regarding: RE: antibiotic Hi Otisha Spickler,  Ms Whites strep culture was negative. Can you call and tell her? I am off today :)  I would prefer she stop the Penicillin. Let me know if she needs anything else. Thank you!  Victorino Dike ----- Message ----- From: Granville Lewis, RN Sent: 11/05/2018   3:39 PM EST To: Duane Lope, NP Subject: antibiotic                                     Hey jennifer, This sweet little girl called back wanting to see if you would go ahead and give her antibiotic for strep.  She said her throat was really hurting and is afraid to wait until the culture comes back.  Thanks

## 2019-02-12 ENCOUNTER — Telehealth: Payer: Self-pay

## 2019-02-12 DIAGNOSIS — Z8619 Personal history of other infectious and parasitic diseases: Secondary | ICD-10-CM

## 2019-02-12 MED ORDER — VALACYCLOVIR HCL 1 G PO TABS: 1000.0000 mg | ORAL_TABLET | Freq: Every day | ORAL | 0 refills | Status: DC

## 2019-02-12 NOTE — Telephone Encounter (Signed)
error 

## 2019-03-04 MED FILL — valACYclovir HCL 1 GM TABS: 1 | 30 days supply | Qty: 30 | Fill #0

## 2019-03-10 ENCOUNTER — Other Ambulatory Visit: Payer: Self-pay | Admitting: Psychiatry

## 2019-03-10 DIAGNOSIS — F422 Mixed obsessional thoughts and acts: Secondary | ICD-10-CM

## 2019-03-10 DIAGNOSIS — F411 Generalized anxiety disorder: Secondary | ICD-10-CM

## 2019-03-11 MED FILL — SERTRALINE HCL 25 MG TABLET: 25 | 90 days supply | Qty: 90 | Fill #0

## 2019-07-28 ENCOUNTER — Encounter (HOSPITAL_COMMUNITY): Payer: Self-pay | Admitting: Licensed Clinical Social Worker

## 2019-07-28 ENCOUNTER — Ambulatory Visit (INDEPENDENT_AMBULATORY_CARE_PROVIDER_SITE_OTHER): Payer: No Typology Code available for payment source | Admitting: Licensed Clinical Social Worker

## 2019-07-28 ENCOUNTER — Other Ambulatory Visit: Payer: Self-pay

## 2019-07-28 DIAGNOSIS — F411 Generalized anxiety disorder: Secondary | ICD-10-CM | POA: Diagnosis not present

## 2019-07-30 NOTE — Progress Notes (Addendum)
Virtual Visit via Video Note  I connected with Joan Guerra on 07/28/19 at  9:00 AM EST by a video enabled telemedicine application and verified that I am speaking with the correct person using two identifiers.  Location: Patient: Home Provider: Office   I discussed the limitations of evaluation and management by telemedicine and the availability of in person appointments. The patient expressed understanding and agreed to proceed.    I discussed the assessment and treatment plan with the patient. The patient was provided an opportunity to ask questions and all were answered. The patient agreed with the plan and demonstrated an understanding of the instructions.   The patient was advised to call back or seek an in-person evaluation if the symptoms worsen or if the condition fails to improve as anticipated.  I provided 55 minutes of non-face-to-face time during this encounter.   Margo Common, LCAS-A     Comprehensive Clinical Assessment (CCA) Note  07/30/2019 Joan Guerra [id number]  Visit Diagnosis:      ICD-10-CM   1. Generalized anxiety disorder  F41.1       CCA Part One  Part One has been completed on paper by the patient.  (See scanned document in Chart Review)  CCA Part Two A  Intake/Chief Complaint:  CCA Intake With Chief Complaint CCA Part Two Date: 07/28/19 CCA Part Two Time: 1210 Chief Complaint/Presenting Problem: Confused about state of marriage, "Need a place to think and share my feelings; I have a lot of shame from a very conservative Christian upbringing." Patients Currently Reported Symptoms/Problems: tearfulness, excessive worry, hx of OCD and anxiety currently managed well. Recently went off all psychotropic medications. Hx of short separation from husband. Collateral Involvement: NA Individual's Strengths: Hard working, intelligent, commited to people she loves Type of Services Patient Feels Are Needed: Individual counseling  Mental Health  Symptoms Depression:  Depression: Tearfulness, Hopelessness, Difficulty Concentrating, Change in energy/activity  Mania:     Anxiety:   Anxiety: Restlessness, Tension, Worrying  Psychosis:     Trauma:     Obsessions:     Compulsions:     Inattention:     Hyperactivity/Impulsivity:     Oppositional/Defiant Behaviors:     Borderline Personality:     Other Mood/Personality Symptoms:      Mental Status Exam Appearance and self-care  Stature:  Stature: Average  Weight:  Weight: Average weight  Clothing:  Clothing: Neat/clean  Grooming:  Grooming: Well-groomed  Cosmetic use:  Cosmetic Use: Age appropriate  Posture/gait:  Posture/Gait: Normal  Motor activity:  Motor Activity: Not Remarkable  Sensorium  Attention:  Attention: Vigilant  Concentration:  Concentration: Normal  Orientation:  Orientation: X5  Recall/memory:  Recall/Memory: Normal  Affect and Mood  Affect:  Affect: Tearful  Mood:  Mood: Euthymic  Relating  Eye contact:  Eye Contact: Avoided  Facial expression:  Facial Expression: Responsive  Attitude toward examiner:  Attitude Toward Examiner: Cooperative  Thought and Language  Speech flow: Speech Flow: Normal  Thought content:  Thought Content: Appropriate to mood and circumstances  Preoccupation:  Preoccupations: Guilt  Hallucinations:     Organization:     Company secretary of Knowledge:  Fund of Knowledge: Average  Intelligence:  Intelligence: Above Average  Abstraction:  Abstraction: Normal  Judgement:  Judgement: Normal  Reality Testing:  Reality Testing: Realistic  Insight:  Insight: Good  Decision Making:  Decision Making: Confused  Social Functioning  Social Maturity:  Social Maturity: Responsible  Social Judgement:  Social Judgement:  Normal  Stress  Stressors:     Coping Ability:     Skill Deficits:     Supports:      Family and Psychosocial History: Family history Marital status: Married Number of Years Married: 5 What types of  issues is patient dealing with in the relationship?: "I'm unsure of whether I want to continue in the relationship" Are you sexually active?: Yes What is your sexual orientation?: heterosexual Does patient have children?: Yes How many children?: 1 How is patient's relationship with their children?: Great, have a 2yo daughter  Childhood History:  Childhood History By whom was/is the patient raised?: Both parents Does patient have siblings?: Yes Did patient suffer any verbal/emotional/physical/sexual abuse as a child?: No Did patient suffer from severe childhood neglect?: No Witnessed domestic violence?: No Has patient been effected by domestic violence as an adult?: No  CCA Part Two B  Employment/Work Situation: Employment / Work Copywriter, advertising Employment situation: Employed 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 How long has patient been employed?: 3 years  Education: Education Did Teacher, adult education From [employer]?: Yes Did Physicist, medical?: Yes What Type of College Degree Do you Have?: [employer] What Was Your Major?: Education Did You Have Any Difficulty At [employer]?: Yes Were Any Medications Ever Prescribed For These Difficulties?: Yes Medications Prescribed For School Difficulties?: anxiety began in college  Religion: Religion/Spirituality Are You A Religious Person?: No  Leisure/Recreation:    Exercise/Diet: Exercise/Diet Do You Exercise?: Yes What Type of Exercise Do You Do?: Run/Walk How Many Times a Week Do You Exercise?: 1-3 times a week Have You Gained or Lost A Significant Amount of Weight in the Past Six Months?: No Do You Follow a Special Diet?: No Do You Have Any Trouble Sleeping?: No  CCA Part Two C  Alcohol/Drug Use: Alcohol / Drug Use History of alcohol / drug use?: No history of alcohol / drug abuse                      CCA Part Three  ASAM's:  Six Dimensions of Multidimensional  Assessment  Dimension 1:  Acute Intoxication and/or Withdrawal Potential:     Dimension 2:  Biomedical Conditions and Complications:     Dimension 3:  Emotional, Behavioral, or Cognitive Conditions and Complications:     Dimension 4:  Readiness to Change:     Dimension 5:  Relapse, Continued use, or Continued Problem Potential:     Dimension 6:  Recovery/Living Environment:      Substance use Disorder (SUD)    Social Function:  Social Functioning Social Maturity: Responsible Social Judgement: Normal  Stress:  Stress Patient Takes Medications The Way The Doctor Instructed?: Other (Comment)(8months ago stopped taking medications) Priority Risk: Low Acuity  Risk Assessment- Self-Harm Potential: Risk Assessment For Self-Harm Potential Thoughts of Self-Harm: No current thoughts  Risk Assessment -Dangerous to Others Potential: Risk Assessment For Dangerous to Others Potential Method: No Plan  DSM5 Diagnoses: Patient Active Problem List   Diagnosis Date Noted  . Family history of breast cancer 11/05/2018  . Acute sore throat 11/05/2018  . Women's annual routine gynecological examination 11/05/2018  . GAD (generalized anxiety disorder) 07/24/2018  . Chronic rhinitis 07/09/2018  . Mild intermittent asthma without complication 56/43/3295  . SVD (spontaneous vaginal delivery) 08/31/2017  . Periurethral laceration, delivered, current hospitalization 08/31/2017  . Normal labor 08/30/2017    Patient Centered Plan: Patient is on the following Treatment Plan(s):  Anxiety  Recommendations for Services/Supports/Treatments: Recommendations for Services/Supports/Treatments Recommendations For Services/Supports/Treatments: Individual Therapy  Treatment Plan Summary:    Referrals to Alternative Service(s): Referred to Alternative Service(s):   Place:   Date:   Time:    Referred to Alternative Service(s):   Place:   Date:   Time:    Referred to Alternative Service(s):   Place:   Date:    Time:    Referred to Alternative Service(s):   Place:   Date:   Time:     Margo CommonWesley E Swan

## 2019-08-11 ENCOUNTER — Ambulatory Visit (INDEPENDENT_AMBULATORY_CARE_PROVIDER_SITE_OTHER): Payer: No Typology Code available for payment source | Admitting: Licensed Clinical Social Worker

## 2019-08-11 ENCOUNTER — Encounter (HOSPITAL_COMMUNITY): Payer: Self-pay | Admitting: Licensed Clinical Social Worker

## 2019-08-11 DIAGNOSIS — F411 Generalized anxiety disorder: Secondary | ICD-10-CM | POA: Diagnosis not present

## 2019-08-11 NOTE — Progress Notes (Signed)
Virtual Visit via Video Note  I connected with Joan Guerra on 08/11/19 at 11:00 AM EST by a video enabled telemedicine application and verified that I am speaking with the correct person using two identifiers.  Location: Patient: Home Provider: Office   I discussed the limitations of evaluation and management by telemedicine and the availability of in person appointments. The patient expressed understanding and agreed to proceed.    I discussed the assessment and treatment plan with the patient. The patient was provided an opportunity to ask questions and all were answered. The patient agreed with the plan and demonstrated an understanding of the instructions.   The patient was advised to call back or seek an in-person evaluation if the symptoms worsen or if the condition fails to improve as anticipated.  I provided 55 minutes of non-face-to-face time during this encounter.   Archie Balboa, LCAS-A    THERAPIST PROGRESS NOTE  Session Time: 11-11:55  Participation Level: Active  Behavioral Response: Well GroomedAlertEuthymic  Type of Therapy: Individual Therapy  Treatment Goals addressed: Coping  Interventions: CBT and Supportive  Summary: Joan Guerra is a 28 y.o. female who presents with GAD. She is active, engaged, makes strong eye contact, and is lucid in session. Her affect is appropriate and mood is consistent in today's virtual session. She reports she has been thinking about our last session and is noticing her thoughts and trying to be non-judgmental towards herself. She recently closed on a new house and recognizes she has many projects and responsibilities right now. She wants to discuss her pattern of desiring "whatever is new and shiny". I inquire about PT's childhood memory of growing up w/ a workaholic father and mother who was concerned w/ achievement. PT reflects openly and makes connections about her current bxs and how her childhood impacted her development of her  core values. I ask PT to begin practicing spending time meditating or "sitting alone w/ her thoughts" since she describes feeling "discontented" when she is not busy w/ something.    Suicidal/Homicidal: Nowithout intent/plan  Therapist Response: I used open question, active listening, and mindfulness based CBT. I helped PT identify core beliefs developed in childhood and how her parental patterns.  Plan: Return again in 2 weeks.  Diagnosis:    ICD-10-CM   1. Generalized anxiety disorder  F41.Joan Guerra Joan Guerra, LCAS-A 08/11/2019

## 2019-08-17 ENCOUNTER — Other Ambulatory Visit: Payer: Self-pay

## 2019-08-17 ENCOUNTER — Ambulatory Visit (INDEPENDENT_AMBULATORY_CARE_PROVIDER_SITE_OTHER): Payer: No Typology Code available for payment source

## 2019-08-17 DIAGNOSIS — Z3202 Encounter for pregnancy test, result negative: Secondary | ICD-10-CM

## 2019-08-17 DIAGNOSIS — N926 Irregular menstruation, unspecified: Secondary | ICD-10-CM

## 2019-08-17 LAB — POCT URINE PREGNANCY: Preg Test, Ur: NEGATIVE

## 2019-08-17 NOTE — Progress Notes (Signed)
Pt here for UPT. Pt states she has had three positive at home UPTs. UPT negative in office. HCG drawn. Pt scheduled NOB appt.

## 2019-08-18 LAB — HCG, QUANTITATIVE, PREGNANCY: HCG, Total, QN: 231 m[IU]/mL

## 2019-08-31 ENCOUNTER — Encounter (HOSPITAL_COMMUNITY): Payer: Self-pay | Admitting: Licensed Clinical Social Worker

## 2019-08-31 ENCOUNTER — Ambulatory Visit (INDEPENDENT_AMBULATORY_CARE_PROVIDER_SITE_OTHER): Payer: No Typology Code available for payment source | Admitting: Licensed Clinical Social Worker

## 2019-08-31 DIAGNOSIS — F411 Generalized anxiety disorder: Secondary | ICD-10-CM | POA: Diagnosis not present

## 2019-08-31 NOTE — Progress Notes (Signed)
Virtual Visit via Video Note  I connected with Joan Guerra on 08/31/19 at  9:00 AM EST by a video enabled telemedicine application and verified that I am speaking with the correct person using two identifiers.  Location: Patient: Home Provider: Office   I discussed the limitations of evaluation and management by telemedicine and the availability of in person appointments. The patient expressed understanding and agreed to proceed.    I discussed the assessment and treatment plan with the patient. The patient was provided an opportunity to ask questions and all were answered. The patient agreed with the plan and demonstrated an understanding of the instructions.   The patient was advised to call back or seek an in-person evaluation if the symptoms worsen or if the condition fails to improve as anticipated.  I provided 55 minutes of non-face-to-face time during this encounter.   Archie Balboa, LCAS-A    THERAPIST PROGRESS NOTE  Session Time: [phone]  Participation Level: Active  Behavioral Response: Well GroomedAlertAnxious and tearfulness  Type of Therapy: Individual Therapy  Treatment Goals addressed: Anxiety  Interventions: Supportive and Other: IFS  Summary: Joan Guerra is a 28 y.o. female who presents with hx of GAD and depressed mood. She is engaged, tearful, lucid, makes good eye contact throughout our virtual session. She reports she is "wanting to to be fully honest today" and discusses a relational issue she is having w/ her husband. This issue makes PT feel guilty and shameful. I inquire about PT's background understanding of the issue and how it has played into her decision making. I use psychoeducation to educate PT on IFS model and different "parts". PT is agreeable to this.    Suicidal/Homicidal: Nowithout intent/plan  Therapist Response: I used open questions, mindfulness exercise, and reflection to help PT build awareness and insight into her anxiety and  depression.   Plan: Return again in 2 weeks.  Diagnosis:    ICD-10-CM   1. Generalized anxiety disorder  F41.[place] Joan Guerra, LCAS-A 08/31/2019

## 2019-09-07 ENCOUNTER — Telehealth: Payer: Self-pay

## 2019-09-07 NOTE — Telephone Encounter (Signed)
Pt has NOB appt scheduled for 09/30/19. Pt called stating that she is not feeling well and having low BP readings. I recommended pt eat foods with sodium and drink plenty of fluids. Pt was instructed to go to [facility] if she continues to not feel well or becomes faint. Pt expressed understanding.

## 2019-09-09 ENCOUNTER — Encounter: Payer: Self-pay | Admitting: *Deleted

## 2019-09-09 ENCOUNTER — Other Ambulatory Visit: Payer: Self-pay | Admitting: *Deleted

## 2019-09-09 DIAGNOSIS — Z3480 Encounter for supervision of other normal pregnancy, unspecified trimester: Secondary | ICD-10-CM

## 2019-09-18 NOTE — L&D Delivery Note (Signed)
Joan Guerra is a 29 y.o. female G2P1001 with IUP at [redacted]w[redacted]d admitted for SROM.  She progressed without augmentation to complete and pushed 30 minutes to deliver.  Cord clamping delayed by several minutes then clamped by CNM and cut by FOB.    Delivery Note At 2:43 AM a viable female was delivered via Vaginal, Spontaneous (Presentation: Left Occiput Anterior).  APGAR: 9, 9; weight pending.   Placenta status: Spontaneous, Intact.  Cord: 3 vessels with the following complications: None.  Anesthesia: Epidural Episiotomy: None Lacerations: None Suture Repair: n/a Est. Blood Loss (mL): 50  Mom to postpartum.  Baby to Couplet care / Skin to Skin.  Rolm Bookbinder CNM 04/18/2020, 3:21 AM

## 2019-09-21 ENCOUNTER — Encounter (HOSPITAL_COMMUNITY): Payer: Self-pay | Admitting: Licensed Clinical Social Worker

## 2019-09-21 ENCOUNTER — Ambulatory Visit (INDEPENDENT_AMBULATORY_CARE_PROVIDER_SITE_OTHER): Payer: No Typology Code available for payment source | Admitting: Licensed Clinical Social Worker

## 2019-09-21 DIAGNOSIS — F411 Generalized anxiety disorder: Secondary | ICD-10-CM | POA: Diagnosis not present

## 2019-09-21 NOTE — Progress Notes (Signed)
Virtual Visit via Video Note  I connected with Joan Guerra on 09/21/19 at  2:30 PM EST by a video enabled telemedicine application and verified that I am speaking with the correct person using two identifiers.  Location: Patient: Home Provider: Office   I discussed the limitations of evaluation and management by telemedicine and the availability of in person appointments. The patient expressed understanding and agreed to proceed.     I discussed the assessment and treatment plan with the patient. The patient was provided an opportunity to ask questions and all were answered. The patient agreed with the plan and demonstrated an understanding of the instructions.   The patient was advised to call back or seek an in-person evaluation if the symptoms worsen or if the condition fails to improve as anticipated.  I provided 55 minutes of non-face-to-face time during this encounter.   Margo Common, LCAS-A    THERAPIST PROGRESS NOTE  Session Time: [phone]  Participation Level: Active  Behavioral Response: Well GroomedAlertEuthymic  Type of Therapy: Individual Therapy  Treatment Goals addressed: Anxiety  Interventions: Supportive  Summary: Joan Guerra is a 29 y.o. female who presents with hx of anxiety. She reports she has been feeling more nauseated from her pregnancy than her previous pregnancy. She reports she spent time w/ her family and it was good to see her mother who has cervical cancer. PT and counselor used IFS style questioning to explore different parts of self and how these parts interact and what they believe. PT revealed she is afraid of afterlife even though she is unsure of what afterlife will be like. PT states she feels better able to articulate her fears than at start of session.   Suicidal/Homicidal: Nowithout intent/plan  Therapist Response: I used open questions, active listening, and IFS style questions to help focus PT's emotional experience and somatic  experience to increase awareness.   Plan: Return again in 2 weeks.  Diagnosis:    ICD-10-CM   1. Generalized anxiety disorder  F41.1        Margo Common, LCAS-A 09/21/2019

## 2019-09-29 ENCOUNTER — Encounter: Payer: Self-pay | Admitting: *Deleted

## 2019-09-30 ENCOUNTER — Ambulatory Visit (INDEPENDENT_AMBULATORY_CARE_PROVIDER_SITE_OTHER): Payer: No Typology Code available for payment source | Admitting: Obstetrics and Gynecology

## 2019-09-30 ENCOUNTER — Other Ambulatory Visit: Payer: Self-pay

## 2019-09-30 ENCOUNTER — Encounter: Payer: Self-pay | Admitting: Obstetrics and Gynecology

## 2019-09-30 VITALS — BP 112/68 | HR 76 | Temp 98.3°F | Wt 143.0 lb

## 2019-09-30 DIAGNOSIS — Z23 Encounter for immunization: Secondary | ICD-10-CM | POA: Diagnosis not present

## 2019-09-30 DIAGNOSIS — Z3A10 10 weeks gestation of pregnancy: Secondary | ICD-10-CM

## 2019-09-30 DIAGNOSIS — Z3201 Encounter for pregnancy test, result positive: Secondary | ICD-10-CM | POA: Diagnosis not present

## 2019-09-30 DIAGNOSIS — Z3480 Encounter for supervision of other normal pregnancy, unspecified trimester: Secondary | ICD-10-CM | POA: Insufficient documentation

## 2019-09-30 DIAGNOSIS — N898 Other specified noninflammatory disorders of vagina: Secondary | ICD-10-CM | POA: Diagnosis not present

## 2019-09-30 DIAGNOSIS — O219 Vomiting of pregnancy, unspecified: Secondary | ICD-10-CM

## 2019-09-30 DIAGNOSIS — B373 Candidiasis of vulva and vagina: Secondary | ICD-10-CM

## 2019-09-30 DIAGNOSIS — Z362 Encounter for other antenatal screening follow-up: Secondary | ICD-10-CM | POA: Diagnosis not present

## 2019-09-30 DIAGNOSIS — Z113 Encounter for screening for infections with a predominantly sexual mode of transmission: Secondary | ICD-10-CM | POA: Diagnosis not present

## 2019-09-30 DIAGNOSIS — Z8619 Personal history of other infectious and parasitic diseases: Secondary | ICD-10-CM

## 2019-09-30 DIAGNOSIS — B379 Candidiasis, unspecified: Secondary | ICD-10-CM

## 2019-09-30 LAB — POCT URINE PREGNANCY: Preg Test, Ur: POSITIVE — AB

## 2019-09-30 MED ORDER — TERCONAZOLE 0.8 % VA CREA: 1.0000 | TOPICAL_CREAM | Freq: Every day | VAGINAL | 0 refills | Status: DC

## 2019-09-30 MED ORDER — VALACYCLOVIR HCL 1 G PO TABS: 1000.0000 mg | ORAL_TABLET | Freq: Every day | ORAL | 0 refills | Status: DC

## 2019-09-30 MED FILL — TERCONAZOLE 0.8% VAGINAL CR: 0.8 | 3 days supply | Qty: 20 | Fill #0

## 2019-09-30 MED FILL — valACYclovir HCL 1 GM TABS: 1 | 30 days supply | Qty: 30 | Fill #0

## 2019-09-30 NOTE — Progress Notes (Signed)
INITIAL PRENATAL VISIT NOTE  Subjective:  Joan Guerra is a 29 y.o. G2P1001 at [redacted]w[redacted]d by LMP being seen today for her initial prenatal visit. This is a planned pregnancy. She and partner are happy with the pregnancy. She was using nothing for birth control previously. She has an obstetric history significant for 1 x SVD, no issues with that pregnancy. She has a medical history significant for post partum OCD. Was on zoloft after last pregnancy but came off it 06/2019.  Patient reports nausea.   . Vag. Bleeding: None.   . Denies leaking of fluid.    Past Medical History:  Diagnosis Date  . Allergy   . Anxiety   . HSV (herpes simplex virus) anogenital infection    cold sores not genital    Past Surgical History:  Procedure Laterality Date  . CHOLECYSTECTOMY      OB History  Gravida Para Term Preterm AB Living  2 1 1     1   SAB TAB Ectopic Multiple Live Births        0 1    # Outcome Date GA Lbr Len/2nd Weight Sex Delivery Anes PTL Lv  2 Current           1 Term 08/31/17 [redacted]w[redacted]d 14:10 / 02:27 7 lb 15.5 oz (3.615 kg) F Vag-Spont EPI  LIV    Social History   Socioeconomic History  . Marital status: Married    Spouse name: Not on file  . Number of children: Not on file  . Years of education: Not on file  . Highest education level: Not on file  Occupational History  . Not on file  Tobacco Use  . Smoking status: Never Smoker  . Smokeless tobacco: Never Used  Substance and Sexual Activity  . Alcohol use: Not Currently    Alcohol/week: 2.0 standard drinks    Types: 2 Standard drinks or equivalent per week  . Drug use: No  . Sexual activity: Yes    Birth control/protection: None  Other Topics Concern  . Not on file  Social History Narrative  . Not on file   Social Determinants of Health   Financial Resource Strain:   . Difficulty of Paying Living Expenses: Not on file  Food Insecurity:   . Worried About Charity fundraiser in the Last Year: Not on file  . Ran Out  of Food in the Last Year: Not on file  Transportation Needs:   . Lack of Transportation (Medical): Not on file  . Lack of Transportation (Non-Medical): Not on file  Physical Activity:   . Days of Exercise per Week: Not on file  . Minutes of Exercise per Session: Not on file  Stress:   . Feeling of Stress : Not on file  Social Connections:   . Frequency of Communication with Friends and Family: Not on file  . Frequency of Social Gatherings with Friends and Family: Not on file  . Attends Religious Services: Not on file  . Active Member of Clubs or Organizations: Not on file  . Attends Archivist Meetings: Not on file  . Marital Status: Not on file    Family History  Problem Relation Age of Onset  . Heart disease Mother   . Hyperlipidemia Mother   . Hypertension Mother   . Autoimmune disease Mother   . Hyperlipidemia Father   . Cancer Maternal Grandmother   . Heart disease Maternal Grandmother   . Hyperlipidemia Maternal Grandmother   .  Hypertension Maternal Grandmother   . Heart disease Paternal Grandmother   . Hyperlipidemia Paternal Grandmother   . Hypertension Paternal Grandmother     Current Outpatient Medications:  .  loratadine (CLARITIN) 10 MG tablet, Take 10 mg by mouth daily as needed for allergies., Disp: , Rfl:  .  Prenatal Vit-Fe Fumarate-FA (PRENATAL VITAMIN PO), Take by mouth., Disp: , Rfl:  .  valACYclovir (VALTREX) 1000 MG tablet, Take 1 tablet (1,000 mg total) by mouth daily., Disp: 30 tablet, Rfl: 0 .  fluticasone (FLONASE) 50 MCG/ACT nasal spray, Place 2 sprays into both nostrils daily for 10 days., Disp: 16 g, Rfl: 0 .  terconazole (TERAZOL 3) 0.8 % vaginal cream, Place 1 applicator vaginally at bedtime. Apply nightly for three nights., Disp: 20 g, Rfl: 0  Allergies  Allergen Reactions  . Buspirone Nausea And Vomiting and Nausea Only    And double vision.  . Azithromycin Rash    Review of Systems: Negative except for what is mentioned in  HPI.  Objective:   Vitals:   09/30/19 0907  BP: 112/68  Pulse: 76  Temp: 98.3 F (36.8 C)  Weight: 143 lb (64.9 kg)   Fetal Status: Fetal Heart Rate (bpm): 163         Physical Exam: BP 112/68   Pulse 76   Temp 98.3 F (36.8 C)   Wt 143 lb (64.9 kg)   LMP 07/21/2019   BMI 23.80 kg/m  CONSTITUTIONAL: Well-developed, well-nourished female in no acute distress.  NEUROLOGIC: Alert and oriented to person, place, and time. Normal reflexes, muscle tone coordination. No cranial nerve deficit noted. PSYCHIATRIC: Normal mood and affect. Normal behavior. Normal judgment and thought content. SKIN: Skin is warm and dry. No rash noted. Not diaphoretic. No erythema. No pallor. HENT:  Normocephalic, atraumatic, External right and left ear normal. Oropharynx is clear and moist EYES: Conjunctivae and EOM are normal. Pupils are equal, round, and reactive to light. No scleral icterus.  NECK: Normal range of motion, supple, no masses CARDIOVASCULAR: Normal heart rate noted, regular rhythm RESPIRATORY: Effort and breath sounds normal, no problems with respiration noted BREASTS: symmetric, non-tender, no masses palpable ABDOMEN: Soft, nontender, nondistended, gravid. GU: normal appearing external female genitalia, multiparous normal appearing cervix, clumpy Shober discharge in vagina, no lesions noted Bimanual: 10 weeks sized uterus, no adnexal tenderness or palpable lesions noted MUSCULOSKELETAL: Normal range of motion. EXT:  No edema and no tenderness. 2+ distal pulses.   Assessment and Plan:  Pregnancy: G2P1001 at [redacted]w[redacted]d by LMP  1. Supervision of other normal pregnancy, antepartum - Obstetric panel - HIV antibody (with reflex) - Culture, OB Urine - Babyscripts Schedule Optimization - [place] bedside; Future - POCT urine pregnancy - Cervicovaginal ancillary only( [place]) - Cytology - PAP( [place]) - [place] MFM OB COMP + 14 WK; Future  3. History of cold sores - valACYclovir (VALTREX)  1000 MG tablet; Take 1 tablet (1,000 mg total) by mouth daily.  Dispense: 30 tablet; Refill: 0  4. Nausea/vomiting in pregnancy To call if worsens  5. Yeast infection terazole sent to pharmacy   Preterm labor symptoms and general obstetric precautions including but not limited to vaginal bleeding, contractions, leaking of fluid and fetal movement were reviewed in detail with the patient.  Please refer to After Visit Summary for other counseling recommendations.   Return in about 4 weeks (around 10/28/2019) for virtual, low OB.  JONNI OELKERS 09/30/2019 10:53 AM

## 2019-09-30 NOTE — Progress Notes (Signed)
Bedside U/S shows single IUP with FHT of 163 BPM and CRL measures 38.63  GA is [redacted]w[redacted]d

## 2019-10-01 LAB — CERVICOVAGINAL ANCILLARY ONLY
Bacterial Vaginitis (gardnerella): NEGATIVE
Candida Glabrata: NEGATIVE
Candida Vaginitis: POSITIVE — AB
Chlamydia: NEGATIVE
Comment: NEGATIVE
Comment: NEGATIVE
Comment: NEGATIVE
Comment: NEGATIVE
Comment: NORMAL
Neisseria Gonorrhea: NEGATIVE

## 2019-10-01 LAB — OBSTETRIC PANEL
Absolute Monocytes: 638 cells/uL (ref 200–950)
Antibody Screen: NOT DETECTED
Basophils Absolute: 46 cells/uL (ref 0–200)
Basophils Relative: 0.4 %
Eosinophils Absolute: 46 cells/uL (ref 15–500)
Eosinophils Relative: 0.4 %
HCT: 40.8 % (ref 35.0–45.0)
Hemoglobin: 14.2 g/dL (ref 11.7–15.5)
Hepatitis B Surface Ag: NONREACTIVE
Lymphs Abs: 1835 cells/uL (ref 850–3900)
MCH: 31.8 pg (ref 27.0–33.0)
MCHC: 34.8 g/dL (ref 32.0–36.0)
MCV: 91.5 fL (ref 80.0–100.0)
MPV: 10.2 fL (ref 7.5–12.5)
Monocytes Relative: 5.6 %
Neutro Abs: 8835 cells/uL — ABNORMAL HIGH (ref 1500–7800)
Neutrophils Relative %: 77.5 %
Platelets: 314 10*3/uL (ref 140–400)
RBC: 4.46 10*6/uL (ref 3.80–5.10)
RDW: 11.8 % (ref 11.0–15.0)
RPR Ser Ql: NONREACTIVE
Rubella: 2.55 Index
Total Lymphocyte: 16.1 %
WBC: 11.4 10*3/uL — ABNORMAL HIGH (ref 3.8–10.8)

## 2019-10-01 LAB — CYTOLOGY - PAP: Diagnosis: NEGATIVE

## 2019-10-01 LAB — HIV ANTIBODY (ROUTINE TESTING W REFLEX): HIV 1&2 Ab, 4th Generation: NONREACTIVE

## 2019-10-02 LAB — CULTURE, OB URINE

## 2019-10-02 LAB — URINE CULTURE, OB REFLEX: Organism ID, Bacteria: NO GROWTH

## 2019-10-05 ENCOUNTER — Encounter (HOSPITAL_COMMUNITY): Payer: Self-pay | Admitting: Licensed Clinical Social Worker

## 2019-10-05 ENCOUNTER — Ambulatory Visit (INDEPENDENT_AMBULATORY_CARE_PROVIDER_SITE_OTHER): Payer: No Typology Code available for payment source | Admitting: Licensed Clinical Social Worker

## 2019-10-05 ENCOUNTER — Other Ambulatory Visit: Payer: Self-pay

## 2019-10-05 DIAGNOSIS — F411 Generalized anxiety disorder: Secondary | ICD-10-CM

## 2019-10-05 DIAGNOSIS — F422 Mixed obsessional thoughts and acts: Secondary | ICD-10-CM | POA: Diagnosis not present

## 2019-10-05 NOTE — Progress Notes (Signed)
Virtual Visit via Video Note  I connected with Joan Guerra on 10/08/19 at 12:30 PM EST by a video enabled telemedicine application and verified that I am speaking with the correct person using two identifiers.  Location: Patient: Home Provider: Office   I discussed the limitations of evaluation and management by telemedicine and the availability of in person appointments. The patient expressed understanding and agreed to proceed.     I discussed the assessment and treatment plan with the patient. The patient was provided an opportunity to ask questions and all were answered. The patient agreed with the plan and demonstrated an understanding of the instructions.   The patient was advised to call back or seek an in-person evaluation if the symptoms worsen or if the condition fails to improve as anticipated.  I provided 55 minutes of non-face-to-face time during this encounter.   Margo Common, LCAS-A    THERAPIST PROGRESS NOTE  Session Time: 12:30-1:25  Participation Level: Active  Behavioral Response: Well GroomedAlertDepressed  Type of Therapy: Individual Therapy  Treatment Goals addressed: Diagnosis: GAD  Interventions: Biofeedback and Other: IFS  Summary: Joan Guerra is a 29 y.o. female who presents with hx of GAD, dx OCD, and is recently pregnant. She is active, engaged in our virtual session while on her lunch break at work. She reports she has been having "health anxiety" and worrying about medication she is prescribed. I invite PT into a mindful state to observe and feel her emotions towards her "worried self". PT becomes tearful and discusses her negative experience in childhood due to her mother's anxiety. I help Pt to take ownership of her own anxiety and teach her a basic mindfulness calming technique for managing overwhelming upset feelings.    Suicidal/Homicidal: Nowithout intent/plan  Therapist Response: I used open questions, active listening, and supportive IFS  therapy to help PT access her problematic parts of self and learn to integrate and become more Self led.  Plan: Return again in 2 weeks.  Diagnosis:    ICD-10-CM   1. Generalized anxiety disorder  F41.1   2. Mixed obsessional thoughts and acts  F42.2     Margo Common, LCAS-A 10/08/2019

## 2019-10-19 ENCOUNTER — Encounter (HOSPITAL_COMMUNITY): Payer: Self-pay | Admitting: Licensed Clinical Social Worker

## 2019-10-19 ENCOUNTER — Ambulatory Visit (INDEPENDENT_AMBULATORY_CARE_PROVIDER_SITE_OTHER): Payer: No Typology Code available for payment source | Admitting: Licensed Clinical Social Worker

## 2019-10-19 DIAGNOSIS — F411 Generalized anxiety disorder: Secondary | ICD-10-CM | POA: Diagnosis not present

## 2019-10-19 DIAGNOSIS — F422 Mixed obsessional thoughts and acts: Secondary | ICD-10-CM | POA: Diagnosis not present

## 2019-10-19 NOTE — Progress Notes (Signed)
Virtual Visit via Video Note  I connected with Joan Guerra on 10/19/19 at 12:30 PM EST by a video enabled telemedicine application and verified that I am speaking with the correct person using two identifiers.  Location: Patient: Home Provider: Office   I discussed the limitations of evaluation and management by telemedicine and the availability of in person appointments. The patient expressed understanding and agreed to proceed.     I discussed the assessment and treatment plan with the patient. The patient was provided an opportunity to ask questions and all were answered. The patient agreed with the plan and demonstrated an understanding of the instructions.   The patient was advised to call back or seek an in-person evaluation if the symptoms worsen or if the condition fails to improve as anticipated.  I provided 55 minutes of non-face-to-face time during this encounter.   Margo Common, LCAS-A    THERAPIST PROGRESS NOTE  Session Time: 7096-283  Participation Level: Active  Behavioral Response: Well GroomedAlertAnxious and Depressed  Type of Therapy: Individual Therapy  Treatment Goals addressed: Anxiety  Interventions: Other: IFS  Summary: Wyoma Genson is a 29 y.o. female who presents with hx of GAD and d/o of mixed obsessional thoughts. She is active, engaged and tearful in session. She reports she has been taking care of her mother who recently had a surgery. PT shares that she has been journaling stream-of-consciousness and it is helping her get more in touch w/ herself. Although she is concerned "she is changing in a way that her husband may not understand". We discuss how PT is worried about her marriage and finds herself asking questions of uncertainty. I spent time validating and reminding PT that her journey of self discovery is just beginning and she is not under any obligation to make a decision about anything today.   Suicidal/Homicidal: Nowithout  intent/plan  Therapist Response: I used open questions, active listening and "parts" therapy w/ an IFS lens.  Plan: Return again in 2 weeks.  Diagnosis:    ICD-10-CM   1. Generalized anxiety disorder  F41.1   2. Mixed obsessional thoughts and acts  F42.2       Margo Common, LCAS-A 10/19/2019

## 2019-10-28 ENCOUNTER — Telehealth (INDEPENDENT_AMBULATORY_CARE_PROVIDER_SITE_OTHER): Payer: No Typology Code available for payment source | Admitting: Obstetrics and Gynecology

## 2019-10-28 DIAGNOSIS — Z3482 Encounter for supervision of other normal pregnancy, second trimester: Secondary | ICD-10-CM

## 2019-10-28 DIAGNOSIS — Z3480 Encounter for supervision of other normal pregnancy, unspecified trimester: Secondary | ICD-10-CM

## 2019-10-28 DIAGNOSIS — Z3A14 14 weeks gestation of pregnancy: Secondary | ICD-10-CM

## 2019-10-28 NOTE — Patient Instructions (Signed)

## 2019-10-28 NOTE — Progress Notes (Signed)
   TELEHEALTH OBSTETRICS PRENATAL VIRTUAL VIDEO VISIT ENCOUNTER NOTE  Provider location: [facility] [place]   I connected with Joan Guerra on 10/28/19 at  9:15 AM EST by WebEx Video Encounter at home and verified that I am speaking with the correct person using two identifiers.   I discussed the limitations, risks, security and privacy concerns of performing an evaluation and management service virtually and the availability of in person appointments. I also discussed with the patient that there may be a patient responsible charge related to this service. The patient expressed understanding and agreed to proceed. Subjective:  Joan Guerra is a 29 y.o. G2P1001 at [redacted]w[redacted]d being seen today for ongoing prenatal care.  She is currently monitored for the following issues for this low-risk pregnancy and has Chronic rhinitis; Mild intermittent asthma without complication; GAD (generalized anxiety disorder); Family history of breast cancer; Supervision of other normal pregnancy, antepartum; and H/O cold sores on their problem list.  Patient reports no complaints.   . Vag. Bleeding: None.   . Denies any leaking of fluid.   The following portions of the patient's history were reviewed and updated as appropriate: allergies, current medications, past family history, past medical history, past social history, past surgical history and problem list.   Objective:   Vitals:   10/28/19 0845  BP: (!) 102/58  Pulse: 82    Fetal Status:           General:  Alert, oriented and cooperative. Patient is in no acute distress.  Respiratory: Normal respiratory effort, no problems with respiration noted  Mental Status: Normal mood and affect. Normal behavior. Normal judgment and thought content.  Rest of physical exam deferred due to type of encounter  Imaging: No results found.  Assessment and Plan:  Pregnancy: G2P1001 at [redacted]w[redacted]d 1. Supervision of other normal pregnancy, antepartum  [place]  is scheduled for March.   Preterm labor symptoms and general obstetric precautions including but not limited to vaginal bleeding, contractions, leaking of fluid and fetal movement were reviewed in detail with the patient. I discussed the assessment and treatment plan with the patient. The patient was provided an opportunity to ask questions and all were answered. The patient agreed with the plan and demonstrated an understanding of the instructions. The patient was advised to call back or seek an in-person office evaluation/go to MAU at [facility] for any urgent or concerning symptoms. Please refer to After Visit Summary for other counseling recommendations.   I provided 10 minutes of face-to-face time during this encounter.  Return in about 4 weeks (around 11/25/2019) for virtual visit Ok .  Future Appointments  Date Time Provider Department Center  12/01/2019  8:30 AM WH-MFC [place] 5 WH-MFCUS MFC-US    Venia Carbon, NP [facility], [facility]

## 2019-11-25 ENCOUNTER — Telehealth (INDEPENDENT_AMBULATORY_CARE_PROVIDER_SITE_OTHER): Payer: No Typology Code available for payment source | Admitting: Obstetrics and Gynecology

## 2019-11-25 ENCOUNTER — Encounter: Payer: Self-pay | Admitting: Obstetrics and Gynecology

## 2019-11-25 DIAGNOSIS — Z3480 Encounter for supervision of other normal pregnancy, unspecified trimester: Secondary | ICD-10-CM

## 2019-11-25 DIAGNOSIS — Z3A18 18 weeks gestation of pregnancy: Secondary | ICD-10-CM

## 2019-11-25 DIAGNOSIS — Z3482 Encounter for supervision of other normal pregnancy, second trimester: Secondary | ICD-10-CM

## 2019-11-25 NOTE — Progress Notes (Signed)
   TELEHEALTH OBSTETRICS PRENATAL VIRTUAL VIDEO VISIT ENCOUNTER NOTE  Provider location: [facility] at [place]   I connected with Joan Guerra on 11/25/19 at  9:30 AM EST by MyChart Video Encounter at home and verified that I am speaking with the correct person using two identifiers.   I discussed the limitations, risks, security and privacy concerns of performing an evaluation and management service virtually and the availability of in person appointments. I also discussed with the patient that there may be a patient responsible charge related to this service. The patient expressed understanding and agreed to proceed. Subjective:  Joan Guerra is a 29 y.o. G2P1001 at [redacted]w[redacted]d being seen today for ongoing prenatal care.  She is currently monitored for the following issues for this low-risk pregnancy and has Chronic rhinitis; Mild intermittent asthma without complication; GAD (generalized anxiety disorder); Family history of breast cancer; Supervision of other normal pregnancy, antepartum; and H/O cold sores on their problem list.  Patient reports no complaints.   .  .   . Denies any leaking of fluid.   The following portions of the patient's history were reviewed and updated as appropriate: allergies, current medications, past family history, past medical history, past social history, past surgical history and problem list.   Objective:   Vitals:   11/25/19 0918  BP: (!) 90/54  Weight: 152 lb (68.9 kg)    Fetal Status:           General:  Alert, oriented and cooperative. Patient is in no acute distress.  Respiratory: Normal respiratory effort, no problems with respiration noted  Mental Status: Normal mood and affect. Normal behavior. Normal judgment and thought content.  Rest of physical exam deferred due to type of encounter  Imaging: No results found.  Assessment and Plan:  Pregnancy: G2P1001 at [redacted]w[redacted]d 1. Supervision of other normal pregnancy, antepartum  [place]  scheduled 3/16 @ MFM Doing well  BP good today    Preterm labor symptoms and general obstetric precautions including but not limited to vaginal bleeding, contractions, leaking of fluid and fetal movement were reviewed in detail with the patient. I discussed the assessment and treatment plan with the patient. The patient was provided an opportunity to ask questions and all were answered. The patient agreed with the plan and demonstrated an understanding of the instructions. The patient was advised to call back or seek an in-person office evaluation/go to [facility]U at [facility] for any urgent or concerning symptoms. Please refer to After Visit Summary for other counseling recommendations.   I provided 10 minutes of face-to-face time during this encounter.  Return in about 4 weeks (around 12/23/2019) for virtual visit is ok .  Future Appointments  Date Time Provider Department Center  12/01/2019  9:15 AM WH-MFC [place] 4 WH-MFCUS MFC-US      Venia Carbon, NP [facility], [facility]

## 2019-11-25 NOTE — Patient Instructions (Signed)

## 2019-12-01 ENCOUNTER — Other Ambulatory Visit: Payer: Self-pay

## 2019-12-01 ENCOUNTER — Ambulatory Visit (HOSPITAL_COMMUNITY)
Admission: RE | Admit: 2019-12-01 | Discharge: 2019-12-01 | Disposition: A | Discharge status: Home / Self Care | Payer: No Typology Code available for payment source | Source: Ambulatory Visit | Attending: Obstetrics and Gynecology | Admitting: Obstetrics and Gynecology

## 2019-12-01 DIAGNOSIS — Z3480 Encounter for supervision of other normal pregnancy, unspecified trimester: Secondary | ICD-10-CM | POA: Diagnosis present

## 2019-12-01 DIAGNOSIS — Z3A19 19 weeks gestation of pregnancy: Secondary | ICD-10-CM | POA: Diagnosis not present

## 2019-12-01 DIAGNOSIS — Z363 Encounter for antenatal screening for malformations: Secondary | ICD-10-CM

## 2019-12-23 ENCOUNTER — Telehealth (INDEPENDENT_AMBULATORY_CARE_PROVIDER_SITE_OTHER): Payer: No Typology Code available for payment source | Admitting: Obstetrics and Gynecology

## 2019-12-23 VITALS — BP 104/59 | Wt 158.0 lb

## 2019-12-23 DIAGNOSIS — R002 Palpitations: Secondary | ICD-10-CM

## 2019-12-23 DIAGNOSIS — Z3480 Encounter for supervision of other normal pregnancy, unspecified trimester: Secondary | ICD-10-CM

## 2019-12-23 NOTE — Progress Notes (Signed)
   TELEHEALTH VIRTUAL OBSTETRICS VISIT ENCOUNTER NOTE  I connected with Joan Guerra on 12/23/19 at  9:10 AM EDT by telephone at home and verified that I am speaking with the correct person using two identifiers.   I discussed the limitations, risks, security and privacy concerns of performing an evaluation and management service by telephone and the availability of in person appointments. I also discussed with the patient that there may be a patient responsible charge related to this service. The patient expressed understanding and agreed to proceed.  Subjective:  Joan Guerra is a 29 y.o. G2P1001 at [redacted]w[redacted]d being followed for ongoing prenatal care.  She is currently monitored for the following issues for this Low risk pregnancy and has Chronic rhinitis; Mild intermittent asthma without complication; GAD (generalized anxiety disorder); Family history of breast cancer; Supervision of other normal pregnancy, antepartum; and H/O cold sores on their problem list.  Patient reports Hx of PVC's, states recently she feels heart palpitations. This occurs throughout the day. Seems to improve with increased oral intake and rest. . Reports fetal movement. Denies any contractions, bleeding or leaking of fluid.   The following portions of the patient's history were reviewed and updated as appropriate: allergies, current medications, past family history, past medical history, past social history, past surgical history and problem list.   Objective:   General:  Alert, oriented and cooperative.   Mental Status: Normal mood and affect perceived. Normal judgment and thought content.  Rest of physical exam deferred due to type of encounter  Assessment and Plan:  Pregnancy: G2P1001 at [redacted]w[redacted]d 1. Palpitations  - Ambulatory referral to Cardiology - scheduled an appointment for next week to see Cardiology. Had a brief workup in PCP office with EKG and labs- WNL.   2. Supervision of other normal pregnancy, antepartum  Doing well.   Preterm labor symptoms and general obstetric precautions including but not limited to vaginal bleeding, contractions, leaking of fluid and fetal movement were reviewed in detail with the patient.  I discussed the assessment and treatment plan with the patient. The patient was provided an opportunity to ask questions and all were answered. The patient agreed with the plan and demonstrated an understanding of the instructions. The patient was advised to call back or seek an in-person office evaluation/go to [facility] at [facility] for any urgent or concerning symptoms. Please refer to After Visit Summary for other counseling recommendations.   I provided 10 minutes of non-face-to-face time during this encounter.  Return in about 4 weeks (around 01/20/2020) for For 2 hour GTT .  Future Appointments  Date Time Provider Department Center  12/31/2019  8:00 AM Little Ishikawa, MD [facility]  01/18/2020  8:30 AM Anyanwu, Jethro Bastos, MD [facility]    Venia Carbon, NP [facility] for [facility], [facility]

## 2019-12-28 NOTE — Progress Notes (Deleted)
Cardiology Office Note:    Date:  12/28/2019   ID:  Joan Guerra, DOB Oct 14, 1990, MRN [id number]  PCP:  Iva Boop, MD  Cardiologist:  No primary care provider on file.  Electrophysiologist:  None   Referring MD: Duane Lope, NP   No chief complaint on file. ***  History of Present Illness:    Joan Guerra is a 29 y.o. female with a hx of asthma who is referred by Venia Carbon, NP for evaluation of palpitations.  She is currently [redacted] weeks pregnant.  Past Medical History:  Diagnosis Date  . Allergy   . Anxiety   . HSV (herpes simplex virus) anogenital infection    cold sores not genital    Past Surgical History:  Procedure Laterality Date  . CHOLECYSTECTOMY      Current Medications: No outpatient medications have been marked as taking for the 12/31/19 encounter (Appointment) with Little Ishikawa, MD.     Allergies:   Buspirone and Azithromycin   Social History   Socioeconomic History  . Marital status: Married    Spouse name: Not on file  . Number of children: Not on file  . Years of education: Not on file  . Highest education level: Not on file  Occupational History  . Not on file  Tobacco Use  . Smoking status: Never Smoker  . Smokeless tobacco: Never Used  Substance and Sexual Activity  . Alcohol use: Not Currently    Alcohol/week: 2.0 standard drinks    Types: 2 Standard drinks or equivalent per week  . Drug use: No  . Sexual activity: Yes    Birth control/protection: None  Other Topics Concern  . Not on file  Social History Narrative  . Not on file   Social Determinants of Health   Financial Resource Strain:   . Difficulty of Paying Living Expenses:   Food Insecurity:   . Worried About Programme researcher, broadcasting/film/video in the Last Year:   . Barista in the Last Year:   Transportation Needs:   . Freight forwarder (Medical):   Marland Kitchen Lack of Transportation (Non-Medical):   Physical Activity:   . Days of Exercise per Week:   . Minutes  of Exercise per Session:   Stress:   . Feeling of Stress :   Social Connections:   . Frequency of Communication with Friends and Family:   . Frequency of Social Gatherings with Friends and Family:   . Attends Religious Services:   . Active Member of Clubs or Organizations:   . Attends Banker Meetings:   Marland Kitchen Marital Status:      Family History: The patient's ***family history includes Autoimmune disease in her mother; Cancer in her maternal grandmother; Heart disease in her maternal grandmother, mother, and paternal grandmother; Hyperlipidemia in her father, maternal grandmother, mother, and paternal grandmother; Hypertension in her maternal grandmother, mother, and paternal grandmother.  ROS:   Please see the history of present illness.    *** All other systems reviewed and are negative.  EKGs/Labs/Other Studies Reviewed:    The following studies were reviewed today: ***  EKG:  EKG is *** ordered today.  The ekg ordered today demonstrates ***  Recent Labs: 09/30/2019: Hemoglobin 14.2; Platelets 314  Recent Lipid Panel No results found for: CHOL, TRIG, HDL, CHOLHDL, VLDL, LDLCALC, LDLDIRECT  Physical Exam:    VS:  LMP 07/21/2019     Wt Readings from Last 3 Encounters:  12/23/19 158 lb (71.7  kg)  11/25/19 152 lb (68.9 kg)  09/30/19 143 lb (64.9 kg)     GEN: *** Well nourished, well developed in no acute distress HEENT: Normal NECK: No JVD; No carotid bruits LYMPHATICS: No lymphadenopathy CARDIAC: ***RRR, no murmurs, rubs, gallops RESPIRATORY:  Clear to auscultation without rales, wheezing or rhonchi  ABDOMEN: Soft, non-tender, non-distended MUSCULOSKELETAL:  No edema; No deformity  SKIN: Warm and dry NEUROLOGIC:  Alert and oriented x 3 PSYCHIATRIC:  Normal affect   ASSESSMENT:    No diagnosis found. PLAN:    In order of problems listed above:  Palpitations:   Medication Adjustments/Labs and Tests Ordered: Current medicines are reviewed at  length with the patient today.  Concerns regarding medicines are outlined above.  No orders of the defined types were placed in this encounter.  No orders of the defined types were placed in this encounter.   There are no Patient Instructions on file for this visit.   Signed, Donato Heinz, MD  12/28/2019 10:07 PM    [place]

## 2019-12-31 ENCOUNTER — Ambulatory Visit: Payer: No Typology Code available for payment source | Admitting: Cardiology

## 2020-01-17 ENCOUNTER — Other Ambulatory Visit: Payer: Self-pay

## 2020-01-17 ENCOUNTER — Inpatient Hospital Stay (HOSPITAL_COMMUNITY)
Admission: AD | Admit: 2020-01-17 | Discharge: 2020-01-17 | Disposition: A | Discharge status: Home / Self Care | Payer: No Typology Code available for payment source | Attending: Obstetrics & Gynecology | Admitting: Obstetrics & Gynecology

## 2020-01-17 ENCOUNTER — Inpatient Hospital Stay (HOSPITAL_BASED_OUTPATIENT_CLINIC_OR_DEPARTMENT_OTHER): Payer: No Typology Code available for payment source

## 2020-01-17 ENCOUNTER — Encounter (HOSPITAL_COMMUNITY): Payer: Self-pay | Admitting: Obstetrics & Gynecology

## 2020-01-17 DIAGNOSIS — N898 Other specified noninflammatory disorders of vagina: Secondary | ICD-10-CM | POA: Insufficient documentation

## 2020-01-17 DIAGNOSIS — O3432 Maternal care for cervical incompetence, second trimester: Secondary | ICD-10-CM

## 2020-01-17 DIAGNOSIS — Z8249 Family history of ischemic heart disease and other diseases of the circulatory system: Secondary | ICD-10-CM | POA: Diagnosis not present

## 2020-01-17 DIAGNOSIS — O99891 Other specified diseases and conditions complicating pregnancy: Secondary | ICD-10-CM

## 2020-01-17 DIAGNOSIS — O26892 Other specified pregnancy related conditions, second trimester: Secondary | ICD-10-CM | POA: Insufficient documentation

## 2020-01-17 DIAGNOSIS — Z8349 Family history of other endocrine, nutritional and metabolic diseases: Secondary | ICD-10-CM | POA: Insufficient documentation

## 2020-01-17 DIAGNOSIS — Z3686 Encounter for antenatal screening for cervical length: Secondary | ICD-10-CM

## 2020-01-17 DIAGNOSIS — Z3A25 25 weeks gestation of pregnancy: Secondary | ICD-10-CM | POA: Diagnosis not present

## 2020-01-17 DIAGNOSIS — Z888 Allergy status to other drugs, medicaments and biological substances status: Secondary | ICD-10-CM | POA: Diagnosis not present

## 2020-01-17 DIAGNOSIS — R109 Unspecified abdominal pain: Secondary | ICD-10-CM | POA: Diagnosis present

## 2020-01-17 LAB — URINALYSIS, ROUTINE W REFLEX MICROSCOPIC
Bilirubin Urine: NEGATIVE
Glucose, UA: NEGATIVE mg/dL
Hgb urine dipstick: NEGATIVE
Ketones, ur: NEGATIVE mg/dL
Leukocytes,Ua: NEGATIVE
Nitrite: NEGATIVE
Protein, ur: NEGATIVE mg/dL
Specific Gravity, Urine: 1.004 — ABNORMAL LOW (ref 1.005–1.030)
pH: 8 (ref 5.0–8.0)

## 2020-01-17 NOTE — MAU Note (Signed)
Joan Guerra is a 29 y.o. at [redacted]w[redacted]d here in MAU reporting: states she has been having BH since 22 weeks. States they come 2-3 times an hour. This morning when she went to the bathroom she saw some discharge that looked like her mucus plug. States she called the on call nurse and they told her to come in. No LOF. No bleeding. +FM  Onset of complaint: today  Pain score: 1/10  Vitals:   01/17/20 0936  BP: 106/61  Pulse: 84  Resp: 15  Temp: 98.7 F (37.1 C)  SpO2: 100%     FHT: +FM  Lab orders placed from triage: UA

## 2020-01-17 NOTE — MAU Provider Note (Signed)
History     CSN: [id number]  Arrival date and time: 01/17/20 7591   First Provider Initiated Contact with Patient 01/17/20 1013      Chief Complaint  Patient presents with  . Abdominal Pain  . Vaginal Discharge   HPI   Ms.Joan Guerra is a 29 y.o. female G2P1001 @ [redacted]w[redacted]d here in MAU with complaints of mucoid brown discharge that she noticed this morning. She reports intercourse last night with no concerns following. She is concerned she has lost her mucus plug. She has no pain or associated bleeding. This is a new problem. She reports braxton hicks contractions since 22 weeks that have not changed in duration or intensity. + fetal movement.   OB History    Gravida  2   Para  1   Term  1   Preterm      AB      Living  1     SAB      TAB      Ectopic      Multiple  0   Live Births  1           Past Medical History:  Diagnosis Date  . Allergy   . Anxiety   . HSV (herpes simplex virus) anogenital infection    cold sores not genital    Past Surgical History:  Procedure Laterality Date  . CHOLECYSTECTOMY      Family History  Problem Relation Age of Onset  . Heart disease Mother   . Hyperlipidemia Mother   . Hypertension Mother   . Autoimmune disease Mother   . Hyperlipidemia Father   . Cancer Maternal Grandmother   . Heart disease Maternal Grandmother   . Hyperlipidemia Maternal Grandmother   . Hypertension Maternal Grandmother   . Heart disease Paternal Grandmother   . Hyperlipidemia Paternal Grandmother   . Hypertension Paternal Grandmother     Social History   Tobacco Use  . Smoking status: Never Smoker  . Smokeless tobacco: Never Used  Substance Use Topics  . Alcohol use: Not Currently    Alcohol/week: 2.0 standard drinks    Types: 2 Standard drinks or equivalent per week  . Drug use: No    Allergies:  Allergies  Allergen Reactions  . Buspirone Nausea And Vomiting and Nausea Only    And double vision.  . Azithromycin Rash     No medications prior to admission.   Results for orders placed or performed during the hospital encounter of 01/17/20 (from the past 48 hour(s))  Urinalysis, Routine w reflex microscopic     Status: Abnormal   Collection Time: 01/17/20 11:10 AM  Result Value Ref Range   Color, Urine STRAW (A) YELLOW   APPearance CLEAR CLEAR   Specific Gravity, Urine 1.004 (L) 1.005 - 1.030   pH 8.0 5.0 - 8.0   Glucose, UA NEGATIVE NEGATIVE mg/dL   Hgb urine dipstick NEGATIVE NEGATIVE   Bilirubin Urine NEGATIVE NEGATIVE   Ketones, ur NEGATIVE NEGATIVE mg/dL   Protein, ur NEGATIVE NEGATIVE mg/dL   Nitrite NEGATIVE NEGATIVE   Leukocytes,Ua NEGATIVE NEGATIVE    Comment: Performed at [facility], [address], [place], [place] [address]    [place] MFM OB LIMITED  Result Date: 01/18/2020 ----------------------------------------------------------------------  OBSTETRICS REPORT                        (Signed Final 01/18/2020 09:26 am) ---------------------------------------------------------------------- Patient Info  ID #:  [id number]                          D.O.B.:  [birth year] (28 yrs)  Name:       Joan Guerra                     Visit Date: 01/17/2020 10:51 am ---------------------------------------------------------------------- Performed By  Attending:        Lin Landsman      Secondary Phy.:    Harolyn Rutherford                    MD                                                              [facility] NP  Performed By:     Birdena Crandall        Address:           [address] [place],                                                              [place] [address]  Referred By:      Ivory Broad DAVIS          [facility]  Ref. Address:     [address]  ---------------------------------------------------------------------- Orders  #  Description                           Code        Ordered By  1  [place] MFM OB LIMITED                     75102.58    Venia Carbon ----------------------------------------------------------------------  #  Order #                     Accession #                Episode #  [id number]  [id number]                 [id number] ---------------------------------------------------------------------- Indications  Encounter for cervical length                   Z36.86  Vaginal discharge during pregnancy in           O26.892  second trimester  [redacted] weeks gestation of pregnancy                 Z3A.25 ---------------------------------------------------------------------- Fetal Evaluation  Num Of Fetuses:          1  Fetal Heart Rate(bpm):   145  Cardiac Activity:        Observed  Presentation:            Cephalic  Placenta:                Posterior  P. Cord Insertion:       Visualized, central  Amniotic Fluid  AFI FV:      Within normal limits                              Largest Pocket(cm)                              4.6 ---------------------------------------------------------------------- OB History  Gravidity:    2         Term:   1  Living:       1 ---------------------------------------------------------------------- Gestational Age  LMP:           25w 5d        Date:  07/21/19                 EDD:   04/26/20  Best:          25w 5d     Det. By:  LMP  (07/21/19)          EDD:   04/26/20 ---------------------------------------------------------------------- Anatomy  Cranium:               Appears normal         Diaphragm:              Appears normal  Cavum:                 Appears normal         Stomach:                Appears normal, left                                                                        sided  Ventricles:            Appears normal         Abdomen:                Appears normal  Cerebellum:            Appears  normal         Abdominal Wall:         Appears nml (cord  insert, abd wall)  Posterior Fossa:       Appears normal         Cord Vessels:           Appears normal (3                                                                        vessel cord)  Heart:                 Appears normal         Kidneys:                Appear normal                         (4CH, axis, and                         situs)  RVOT:                  Appears normal         Bladder:                Appears normal  LVOT:                  Appears normal         Spine:                  Appears normal  Aortic Arch:           Appears normal ---------------------------------------------------------------------- Cervix Uterus Adnexa  Cervix  Length:            3.6  cm.  Funneling of internal os noted.  Uterus  No abnormality visualized.  Cul De Sac  No free fluid seen. ---------------------------------------------------------------------- Impression  Limited exam to evaluat vaginal discharge  Cervical length is within normal limits, there is a mild amount  of funneling. However given the normal cervixal length and  gestational age clinical significance is uncertain giv ---------------------------------------------------------------------- Recommendations  Follow up clincally indicated. ----------------------------------------------------------------------               Lin Landsmanorenthian Booker, MD Electronically Signed Final Report   01/18/2020 09:26 am ----------------------------------------------------------------------  Review of Systems  Gastrointestinal: Negative for abdominal pain.  Genitourinary: Positive for vaginal discharge. Negative for vaginal bleeding.   Physical Exam   Blood pressure (!) 105/57, pulse 77, temperature 98.7 F (37.1 C), temperature source Oral, resp. rate 16, height 5\' 5"  (1.651 m), weight 73.9 kg, last menstrual period 07/21/2019, SpO2 100  %.  Physical Exam  Constitutional: She is oriented to person, place, and time. She appears well-developed and well-nourished. No distress.  HENT:  Head: Normocephalic.  Genitourinary:    Genitourinary Comments: Vagina - Small amount of thick, pale brown, mucoid vaginal discharge, no odor  Cervix - No contact bleeding, no active bleeding  Bimanual exam: Cervix closed, thick Chaperone present for exam.    Musculoskeletal:        General: Normal range of motion.  Neurological: She is alert and oriented to person, place, and time.  Skin: Skin is warm. She is not diaphoretic.  Psychiatric: Her behavior  is normal.   Fetal Tracing: Baseline: 145 bpm Variability: Moderate  Accelerations: 10x10 Decelerations: None Toco: quiet   MAU Course  Procedures  None  MDM A positive blood type  No bleeding on exam. Cervix closed, thick. Some funneling found on limited US. Discussed with Dr. Roselie Awkward. [place] for DC home. Strict return precautions.   Assessment and Plan   A:  1. Cervical funneling affecting pregnancy in second trimester   2. Vaginal discharge in pregnancy in second trimester   3. [redacted] weeks gestation of pregnancy     P:  Discharge home in stable condition Pelvic rest Return to MAU if symptoms worsen F/u tomorrow as scheduled  Lezlie Lye, NP 01/18/2020 10:36 AM

## 2020-01-17 NOTE — Discharge Instructions (Signed)
Activity Restriction During Pregnancy Your health care provider may recommend specific activity restrictions during pregnancy for a variety of reasons. Activity restriction may require that you limit activities that require great effort, such as exercise, lifting, or sex. The type of activity restriction will vary for each person, depending on your risk or the problems you are having. Activity restriction may be recommended for a period of time until your baby is delivered. Why are activity restrictions recommended? Activity restriction may be recommended if:  Your placenta is partially or completely covering the opening of your cervix (placenta previa).  There is bleeding between the wall of the uterus and the amniotic sac in the first trimester of pregnancy (subchorionic hemorrhage).  You went into labor too early (preterm labor).  You have a history of miscarriage.  You have a condition that causes high blood pressure during pregnancy (preeclampsia or eclampsia).  You are pregnant with more than one baby.  Your baby is not growing well. What are the risks? The risks depend on your specific restriction. Strict bed rest has the most physical and emotional risks and is no longer routinely recommended. Risks of strict bed rest include:  Loss of muscle conditioning from not moving.  Blood clots.  Social isolation.  Depression.  Loss of income. Talk with your health care team about activity restriction to decide if it is best for you and your baby. Even if you are having problems during your pregnancy, you may be able to continue with normal levels of activity with careful monitoring by your health care team. Follow these instructions at home: If needed, based on your overall health and the health of your baby, your health care provider will decide which type of activity restriction is right for you. Activity restrictions may include:  Not lifting anything heavier than 10 pounds (4.5  kg).  Avoiding activities that take a lot of physical effort.  No lifting or straining.  Resting in a sitting position or lying down for periods of time during the day. Pelvic rest may be recommended along with activity restrictions. If pelvic rest is recommended, then:  Do not have sex, an orgasm, or use sexual stimulation.  Do not use tampons. Do not douche. Do not put anything into your vagina.  Do not lift anything that is heavier than 10 lb (4.5 kg).  Avoid activities that require a lot of effort.  Avoid any activity in which your pelvic muscles could become strained, such as squatting. Questions to ask your health care provider  Why is my activity being limited?  How will activity restrictions affect my body?  Why is rest helpful for me and my baby?  What activities can I do?  When can I return to normal activities? When should I seek immediate medical care? Seek immediate medical care if you have:  Vaginal bleeding.  Vaginal discharge.  Cramping pain in your lower abdomen.  Regular contractions.  A low, dull backache. Summary  Your health care provider may recommend specific activity restrictions during pregnancy for a variety of reasons.  Activity restriction may require that you limit activities such as exercise, lifting, sex, or any other activity that requires great effort.  Discuss the risks and benefits of activity restriction with your health care team to decide if it is best for you and your baby.  Contact your health care provider right away if you think you are having contractions, or if you notice vaginal bleeding, discharge, or cramping. This information is not   intended to replace advice given to you by your health care provider. Make sure you discuss any questions you have with your health care provider. Document Revised: 05/27/2019 Document Reviewed: 12/24/2017 Elsevier Patient Education  2020 [employer]vier Inc.  

## 2020-01-18 ENCOUNTER — Ambulatory Visit (INDEPENDENT_AMBULATORY_CARE_PROVIDER_SITE_OTHER): Payer: No Typology Code available for payment source | Admitting: Obstetrics & Gynecology

## 2020-01-18 VITALS — BP 118/74 | HR 95 | Wt 159.8 lb

## 2020-01-18 DIAGNOSIS — Z3480 Encounter for supervision of other normal pregnancy, unspecified trimester: Secondary | ICD-10-CM

## 2020-01-18 NOTE — Progress Notes (Signed)
   PRENATAL VISIT NOTE  Subjective:  Joan Guerra is a 29 y.o. G2P1001 at [redacted]w[redacted]d being seen today for ongoing prenatal care.  She is currently monitored for the following issues for this low-risk pregnancy and has Chronic rhinitis; Mild intermittent asthma without complication; GAD (generalized anxiety disorder); Family history of breast cancer; Supervision of other normal pregnancy, antepartum; and H/O cold sores on their problem list.  Patient reports that she went to MAU last night for loss of mucus plug.  Preliminary read of limted scan showed 3.6 cm cervical length with possible funneling of internal os.  Ruled out for PTL in MAU, sent home with precautions.  Contractions: Not present. Vag. Bleeding: None.  Movement: Present. Denies leaking of fluid.   The following portions of the patient's history were reviewed and updated as appropriate: allergies, current medications, past family history, past medical history, past social history, past surgical history and problem list.   Objective:   Vitals:   01/18/20 0815  BP: 118/74  Pulse: 95  Weight: 159 lb 12.8 oz (72.5 kg)    Fetal Status: Fetal Heart Rate (bpm): 154 Fundal Height: 26 cm Movement: Present     General:  Alert, oriented and cooperative. Patient is in no acute distress.  Skin: Skin is warm and dry. No rash noted.   Cardiovascular: Normal heart rate noted  Respiratory: Normal respiratory effort, no problems with respiration noted  Abdomen: Soft, gravid, appropriate for gestational age.  Pain/Pressure: Absent     Pelvic: Cervical exam deferred        Extremities: Normal range of motion.  Edema: None  Mental Status: Normal mood and affect. Normal behavior. Normal judgment and thought content.   Assessment and Plan:  Pregnancy: G2P1001 at [redacted]w[redacted]d 1. Supervision of other normal pregnancy, antepartum Labs today, will get Tdap next visit.   - 2Hr GTT w/ 1 Hr Carpenter 75 g - HIV antibody (with reflex) - CBC - RPR Preterm  labor symptoms and general obstetric precautions including but not limited to vaginal bleeding, contractions, leaking of fluid and fetal movement were reviewed in detail with the patient. Please refer to After Visit Summary for other counseling recommendations.   Return in about 3 weeks (around 02/08/2020) for OFFICE OB Visit and Tdap.  Future Appointments  Date Time Provider Department Center  02/08/2020  8:45 AM Lesly Dukes, MD [facility]    Jaynie Collins, MD

## 2020-01-18 NOTE — Patient Instructions (Addendum)
Return to office for any scheduled appointments. Call the office or go to the MAU at [facility] [place] if:  You begin to have strong, frequent contractions  Your water breaks.  Sometimes it is a big gush of fluid, sometimes it is just a trickle that keeps getting your panties wet or running down your legs  You have vaginal bleeding.  It is normal to have a small amount of spotting if your cervix was checked.   You do not feel your baby moving like normal.  If you do not, get something to eat and drink and lay down and focus on feeling your baby move.   If your baby is still not moving like normal, you should call the office or go to MAU.  Any other obstetric concerns.  TDaP Vaccine Pregnancy Get the Whooping Cough Vaccine While You Are Pregnant (CDC)  It is important for women to get the whooping cough vaccine in the third trimester of each pregnancy. Vaccines are the best way to prevent this disease. There are 2 different whooping cough vaccines. Both vaccines combine protection against whooping cough, tetanus and diphtheria, but they are for different age groups: Tdap: for everyone 11 years or older, including pregnant women  DTaP: for children 2 months through 6 years of age  You need the whooping cough vaccine during each of your pregnancies The recommended time to get the shot is during your 27th through 36th week of pregnancy, preferably during the earlier part of this time period. The Centers for Disease Control and Prevention (CDC) recommends that pregnant women receive the whooping cough vaccine for adolescents and adults (called Tdap vaccine) during the third trimester of each pregnancy. The recommended time to get the shot is during your 27th through 36th week of pregnancy, preferably during the earlier part of this time period. This replaces the original recommendation that pregnant women get the vaccine only if they had not previously received it. The  American College of Obstetricians and Gynecologists and the American College of Nurse-Midwives support this recommendation.  You should get the whooping cough vaccine while pregnant to pass protection to your baby frame support disabled and/or not supported in this browser  Learn why Laura decided to get the whooping cough vaccine in her 3rd trimester of pregnancy and how her baby girl was born with some protection against the disease. Also available on YouTube. After receiving the whooping cough vaccine, your body will create protective antibodies (proteins produced by the body to fight off diseases) and pass some of them to your baby before birth. These antibodies provide your baby some short-term protection against whooping cough in early life. These antibodies can also protect your baby from some of the more serious complications that come along with whooping cough. Your protective antibodies are at their highest about 2 weeks after getting the vaccine, but it takes time to pass them to your baby. So the preferred time to get the whooping cough vaccine is early in your third trimester. The amount of whooping cough antibodies in your body decreases over time. That is why CDC recommends you get a whooping cough vaccine during each pregnancy. Doing so allows each of your babies to get the greatest number of protective antibodies from you. This means each of your babies will get the best protection possible against this disease.  Getting the whooping cough vaccine while pregnant is better than getting the vaccine after you give birth Whooping cough vaccination during   pregnancy is ideal so your baby will have short-term protection as soon as he is born. This early protection is important because your baby will not start getting his whooping cough vaccines until he is 2 months old. These first few months of life are when your baby is at greatest risk for catching whooping cough. This is also when he's at  greatest risk for having severe, potentially life-threating complications from the infection. To avoid that gap in protection, it is best to get a whooping cough vaccine during pregnancy. You will then pass protection to your baby before he is born. To continue protecting your baby, he should get whooping cough vaccines starting at 2 months old. You may never have gotten the Tdap vaccine before and did not get it during this pregnancy. If so, you should make sure to get the vaccine immediately after you give birth, before leaving the hospital or birthing center. It will take about 2 weeks before your body develops protection (antibodies) in response to the vaccine. Once you have protection from the vaccine, you are less likely to give whooping cough to your newborn while caring for him. But remember, your baby will still be at risk for catching whooping cough from others. A recent study looked to see how effective Tdap was at preventing whooping cough in babies whose mothers got the vaccine while pregnant or in the hospital after giving birth. The study found that getting Tdap between 27 through 36 weeks of pregnancy is 85% more effective at preventing whooping cough in babies younger than 2 months old. Blood tests cannot tell if you need a whooping cough vaccine There are no blood tests that can tell you if you have enough antibodies in your body to protect yourself or your baby against whooping cough. Even if you have been sick with whooping cough in the past or previously received the vaccine, you still should get the vaccine during each pregnancy. Breastfeeding may pass some protective antibodies onto your baby By breastfeeding, you may pass some antibodies you have made in response to the vaccine to your baby. When you get a whooping cough vaccine during your pregnancy, you will have antibodies in your breast milk that you can share with your baby as soon as your milk comes in. However, your baby will not  get protective antibodies immediately if you wait to get the whooping cough vaccine until after delivering your baby. This is because it takes about 2 weeks for your body to create antibodies. Learn more about the health benefits of breastfeeding.  

## 2020-01-19 LAB — 2HR GTT W 1 HR, CARPENTER, 75 G
Glucose, 1 Hr, Gest: 131 mg/dL (ref 65–179)
Glucose, 2 Hr, Gest: 111 mg/dL (ref 65–152)
Glucose, Fasting, Gest: 70 mg/dL (ref 65–91)

## 2020-01-19 LAB — CBC
HCT: 33 % — ABNORMAL LOW (ref 35.0–45.0)
Hemoglobin: 11.5 g/dL — ABNORMAL LOW (ref 11.7–15.5)
MCH: 32.5 pg (ref 27.0–33.0)
MCHC: 34.8 g/dL (ref 32.0–36.0)
MCV: 93.2 fL (ref 80.0–100.0)
MPV: 9.7 fL (ref 7.5–12.5)
Platelets: 279 10*3/uL (ref 140–400)
RBC: 3.54 10*6/uL — ABNORMAL LOW (ref 3.80–5.10)
RDW: 12 % (ref 11.0–15.0)
WBC: 10.8 10*3/uL (ref 3.8–10.8)

## 2020-01-19 LAB — HIV ANTIBODY (ROUTINE TESTING W REFLEX): HIV 1&2 Ab, 4th Generation: NONREACTIVE

## 2020-01-19 LAB — RPR: RPR Ser Ql: NONREACTIVE

## 2020-01-20 ENCOUNTER — Encounter: Payer: Self-pay | Admitting: Obstetrics and Gynecology

## 2020-01-20 DIAGNOSIS — O3430 Maternal care for cervical incompetence, unspecified trimester: Secondary | ICD-10-CM | POA: Insufficient documentation

## 2020-01-22 ENCOUNTER — Encounter: Payer: No Typology Code available for payment source | Admitting: Certified Nurse Midwife

## 2020-02-08 ENCOUNTER — Encounter: Payer: No Typology Code available for payment source | Admitting: Obstetrics & Gynecology

## 2020-02-16 ENCOUNTER — Ambulatory Visit (INDEPENDENT_AMBULATORY_CARE_PROVIDER_SITE_OTHER): Payer: No Typology Code available for payment source | Admitting: Advanced Practice Midwife

## 2020-02-16 ENCOUNTER — Other Ambulatory Visit: Payer: Self-pay

## 2020-02-16 VITALS — BP 111/67 | HR 92 | Wt 168.0 lb

## 2020-02-16 DIAGNOSIS — O3432 Maternal care for cervical incompetence, second trimester: Secondary | ICD-10-CM

## 2020-02-16 DIAGNOSIS — Z3A30 30 weeks gestation of pregnancy: Secondary | ICD-10-CM

## 2020-02-16 DIAGNOSIS — Z3480 Encounter for supervision of other normal pregnancy, unspecified trimester: Secondary | ICD-10-CM

## 2020-02-16 DIAGNOSIS — Z23 Encounter for immunization: Secondary | ICD-10-CM | POA: Diagnosis not present

## 2020-02-16 NOTE — Patient Instructions (Signed)
Third Trimester of Pregnancy The third trimester is from week 28 through week 40 (months 7 through 9). The third trimester is a time when the unborn baby (fetus) is growing rapidly. At the end of the ninth month, the fetus is about 20 inches in length and weighs 6-10 pounds. Body changes during your third trimester Your body will continue to go through many changes during pregnancy. The changes vary from woman to woman. During the third trimester:  Your weight will continue to increase. You can expect to gain 25-35 pounds (11-16 kg) by the end of the pregnancy.  You may begin to get stretch marks on your hips, abdomen, and breasts.  You may urinate more often because the fetus is moving lower into your pelvis and pressing on your bladder.  You may develop or continue to have heartburn. This is caused by increased hormones that slow down muscles in the digestive tract.  You may develop or continue to have constipation because increased hormones slow digestion and cause the muscles that push waste through your intestines to relax.  You may develop hemorrhoids. These are swollen veins (varicose veins) in the rectum that can itch or be painful.  You may develop swollen, bulging veins (varicose veins) in your legs.  You may have increased body aches in the pelvis, back, or thighs. This is due to weight gain and increased hormones that are relaxing your joints.  You may have changes in your hair. These can include thickening of your hair, rapid growth, and changes in texture. Some women also have hair loss during or after pregnancy, or hair that feels dry or thin. Your hair will most likely return to normal after your baby is born.  Your breasts will continue to grow and they will continue to become tender. A yellow fluid (colostrum) may leak from your breasts. This is the first milk you are producing for your baby.  Your belly button may stick out.  You may notice more swelling in your hands,  face, or ankles.  You may have increased tingling or numbness in your hands, arms, and legs. The skin on your belly may also feel numb.  You may feel short of breath because of your expanding uterus.  You may have more problems sleeping. This can be caused by the size of your belly, increased need to urinate, and an increase in your body's metabolism.  You may notice the fetus "dropping," or moving lower in your abdomen (lightening).  You may have increased vaginal discharge.  You may notice your joints feel loose and you may have pain around your pelvic bone. What to expect at prenatal visits You will have prenatal exams every 2 weeks until week 36. Then you will have weekly prenatal exams. During a routine prenatal visit:  You will be weighed to make sure you and the baby are growing normally.  Your blood pressure will be taken.  Your abdomen will be measured to track your baby's growth.  The fetal heartbeat will be listened to.  Any test results from the previous visit will be discussed.  You may have a cervical check near your due date to see if your cervix has softened or thinned (effaced).  You will be tested for Group B streptococcus. This happens between 35 and 37 weeks. Your health care provider may ask you:  What your birth plan is.  How you are feeling.  If you are feeling the baby move.  If you have had any abnormal   symptoms, such as leaking fluid, bleeding, severe headaches, or abdominal cramping.  If you are using any tobacco products, including cigarettes, chewing tobacco, and electronic cigarettes.  If you have any questions. Other tests or screenings that may be performed during your third trimester include:  Blood tests that check for low iron levels (anemia).  Fetal testing to check the health, activity level, and growth of the fetus. Testing is done if you have certain medical conditions or if there are problems during the pregnancy.  Nonstress test  (NST). This test checks the health of your baby to make sure there are no signs of problems, such as the baby not getting enough oxygen. During this test, a belt is placed around your belly. The baby is made to move, and its heart rate is monitored during movement. What is false labor? False labor is a condition in which you feel small, irregular tightenings of the muscles in the womb (contractions) that usually go away with rest, changing position, or drinking water. These are called Braxton Hicks contractions. Contractions may last for hours, days, or even weeks before true labor sets in. If contractions come at regular intervals, become more frequent, increase in intensity, or become painful, you should see your health care provider. What are the signs of labor?  Abdominal cramps.  Regular contractions that start at 10 minutes apart and become stronger and more frequent with time.  Contractions that start on the top of the uterus and spread down to the lower abdomen and back.  Increased pelvic pressure and dull back pain.  A watery or bloody mucus discharge that comes from the vagina.  Leaking of amniotic fluid. This is also known as your "water breaking." It could be a slow trickle or a gush. Let your health care provider know if it has a color or strange odor. If you have any of these signs, call your health care provider right away, even if it is before your due date. Follow these instructions at home: Medicines  Follow your health care provider's instructions regarding medicine use. Specific medicines may be either safe or unsafe to take during pregnancy.  Take a prenatal vitamin that contains at least 600 micrograms (mcg) of folic acid.  If you develop constipation, try taking a stool softener if your health care provider approves. Eating and drinking   Eat a balanced diet that includes fresh fruits and vegetables, whole grains, good sources of protein such as meat, eggs, or tofu,  and low-fat dairy. Your health care provider will help you determine the amount of weight gain that is right for you.  Avoid raw meat and uncooked cheese. These carry germs that can cause birth defects in the baby.  If you have low calcium intake from food, talk to your health care provider about whether you should take a daily calcium supplement.  Eat four or five small meals rather than three large meals a day.  Limit foods that are high in fat and processed sugars, such as fried and sweet foods.  To prevent constipation: ? Drink enough fluid to keep your urine clear or pale yellow. ? Eat foods that are high in fiber, such as fresh fruits and vegetables, whole grains, and beans. Activity  Exercise only as directed by your health care provider. Most women can continue their usual exercise routine during pregnancy. Try to exercise for 30 minutes at least 5 days a week. Stop exercising if you experience uterine contractions.  Avoid heavy lifting.  Do   not exercise in extreme heat or humidity, or at high altitudes.  Wear low-heel, comfortable shoes.  Practice good posture.  You may continue to have sex unless your health care provider tells you otherwise. Relieving pain and discomfort  Take frequent breaks and rest with your legs elevated if you have leg cramps or low back pain.  Take warm sitz baths to soothe any pain or discomfort caused by hemorrhoids. Use hemorrhoid cream if your health care provider approves.  Wear a good support bra to prevent discomfort from breast tenderness.  If you develop varicose veins: ? Wear support pantyhose or compression stockings as told by your healthcare provider. ? Elevate your feet for 15 minutes, 3-4 times a day. Prenatal care  Write down your questions. Take them to your prenatal visits.  Keep all your prenatal visits as told by your health care provider. This is important. Safety  Wear your seat belt at all times when driving.  Make  a list of emergency phone numbers, including numbers for family, friends, the hospital, and police and fire departments. General instructions  Avoid cat litter boxes and soil used by cats. These carry germs that can cause birth defects in the baby. If you have a cat, ask someone to clean the litter box for you.  Do not travel far distances unless it is absolutely necessary and only with the approval of your health care provider.  Do not use hot tubs, steam rooms, or saunas.  Do not drink alcohol.  Do not use any products that contain nicotine or tobacco, such as cigarettes and e-cigarettes. If you need help quitting, ask your health care provider.  Do not use any medicinal herbs or unprescribed drugs. These chemicals affect the formation and growth of the baby.  Do not douche or use tampons or scented sanitary pads.  Do not cross your legs for long periods of time.  To prepare for the arrival of your baby: ? Take prenatal classes to understand, practice, and ask questions about labor and delivery. ? Make a trial run to the hospital. ? Visit the hospital and tour the maternity area. ? Arrange for maternity or paternity leave through employers. ? Arrange for family and friends to take care of pets while you are in the hospital. ? Purchase a rear-facing car seat and make sure you know how to install it in your car. ? Pack your hospital bag. ? Prepare the baby's nursery. Make sure to remove all pillows and stuffed animals from the baby's crib to prevent suffocation.  Visit your dentist if you have not gone during your pregnancy. Use a soft toothbrush to brush your teeth and be gentle when you floss. Contact a health care provider if:  You are unsure if you are in labor or if your water has broken.  You become dizzy.  You have mild pelvic cramps, pelvic pressure, or nagging pain in your abdominal area.  You have lower back pain.  You have persistent nausea, vomiting, or  diarrhea.  You have an unusual or bad smelling vaginal discharge.  You have pain when you urinate. Get help right away if:  Your water breaks before 37 weeks.  You have regular contractions less than 5 minutes apart before 37 weeks.  You have a fever.  You are leaking fluid from your vagina.  You have spotting or bleeding from your vagina.  You have severe abdominal pain or cramping.  You have rapid weight loss or weight gain.  You have   shortness of breath with chest pain.  You notice sudden or extreme swelling of your face, hands, ankles, feet, or legs.  Your baby makes fewer than 10 movements in 2 hours.  You have severe headaches that do not go away when you take medicine.  You have vision changes. Summary  The third trimester is from week 28 through week 40, months 7 through 9. The third trimester is a time when the unborn baby (fetus) is growing rapidly.  During the third trimester, your discomfort may increase as you and your baby continue to gain weight. You may have abdominal, leg, and back pain, sleeping problems, and an increased need to urinate.  During the third trimester your breasts will keep growing and they will continue to become tender. A yellow fluid (colostrum) may leak from your breasts. This is the first milk you are producing for your baby.  False labor is a condition in which you feel small, irregular tightenings of the muscles in the womb (contractions) that eventually go away. These are called Braxton Hicks contractions. Contractions may last for hours, days, or even weeks before true labor sets in.  Signs of labor can include: abdominal cramps; regular contractions that start at 10 minutes apart and become stronger and more frequent with time; watery or bloody mucus discharge that comes from the vagina; increased pelvic pressure and dull back pain; and leaking of amniotic fluid. This information is not intended to replace advice given to you by your  health care provider. Make sure you discuss any questions you have with your health care provider. Document Revised: 12/25/2018 Document Reviewed: 10/09/2016 Elsevier Patient Education  2020 [employer] Inc[employer]  

## 2020-02-16 NOTE — Progress Notes (Signed)
   PRENATAL VISIT NOTE  Subjective:  Joan Guerra is a 29 y.o. G2P1001 at [redacted]w[redacted]d being seen today for ongoing prenatal care.  She is currently monitored for the following issues for this low-risk pregnancy and has Chronic rhinitis; Mild intermittent asthma without complication; GAD (generalized anxiety disorder); Family history of breast cancer; Supervision of other normal pregnancy, antepartum; H/O cold sores; and Cervical funneling affecting pregnancy on their problem list.  Patient reports no complaints.  Contractions: Not present. Vag. Bleeding: None.  Movement: Present. Denies leaking of fluid.   The following portions of the patient's history were reviewed and updated as appropriate: allergies, current medications, past family history, past medical history, past social history, past surgical history and problem list.   Objective:   Vitals:   02/16/20 1421  BP: 111/67  Pulse: 92  Weight: 168 lb (76.2 kg)    Fetal Status:     Movement: Present     General:  Alert, oriented and cooperative. Patient is in no acute distress.  Skin: Skin is warm and dry. No rash noted.   Cardiovascular: Normal heart rate noted  Respiratory: Normal respiratory effort, no problems with respiration noted  Abdomen: Soft, gravid, appropriate for gestational age.  Pain/Pressure: Absent     Pelvic: Cervical exam deferred        Extremities: Normal range of motion.  Edema: None  Mental Status: Normal mood and affect. Normal behavior. Normal judgment and thought content.   Assessment and Plan:  Pregnancy: G2P1001 at [redacted]w[redacted]d 1. Supervision of other normal pregnancy, antepartum --Anticipatory guidance about next visits/weeks of pregnancy given. - Tdap vaccine greater than or equal to 7yo IM --Next visit in 3 weeks, virtual, then at 36 in office  2. Cervical funneling affecting pregnancy in second trimester --No s/sx of preterm labor, cervical length was wnl at time funneling was noted on 01/18/20.  Preterm  labor symptoms and general obstetric precautions including but not limited to vaginal bleeding, contractions, leaking of fluid and fetal movement were reviewed in detail with the patient. Please refer to After Visit Summary for other counseling recommendations.   Return in about 3 weeks (around 03/08/2020).  No future appointments.  Sharen Counter, CNM

## 2020-02-18 ENCOUNTER — Other Ambulatory Visit: Payer: Self-pay

## 2020-02-18 DIAGNOSIS — R002 Palpitations: Secondary | ICD-10-CM

## 2020-02-18 NOTE — Progress Notes (Signed)
Referral for cardiology per Venia Carbon NP

## 2020-02-19 ENCOUNTER — Other Ambulatory Visit: Payer: Self-pay

## 2020-02-19 ENCOUNTER — Encounter: Payer: Self-pay | Admitting: Cardiology

## 2020-02-19 ENCOUNTER — Ambulatory Visit (INDEPENDENT_AMBULATORY_CARE_PROVIDER_SITE_OTHER): Payer: No Typology Code available for payment source | Admitting: Cardiology

## 2020-02-19 VITALS — BP 106/62 | HR 110 | Ht 65.0 in | Wt 168.3 lb

## 2020-02-19 DIAGNOSIS — R002 Palpitations: Secondary | ICD-10-CM

## 2020-02-19 DIAGNOSIS — Z3A31 31 weeks gestation of pregnancy: Secondary | ICD-10-CM | POA: Diagnosis not present

## 2020-02-19 DIAGNOSIS — I493 Ventricular premature depolarization: Secondary | ICD-10-CM

## 2020-02-19 LAB — BASIC METABOLIC PANEL
BUN/Creatinine Ratio: 15 (ref 9–23)
BUN: 7 mg/dL (ref 6–20)
CO2: 21 mmol/L (ref 20–29)
Calcium: 9.2 mg/dL (ref 8.7–10.2)
Chloride: 102 mmol/L (ref 96–106)
Creatinine, Ser: 0.48 mg/dL — ABNORMAL LOW (ref 0.57–1.00)
GFR calc Af Amer: 155 mL/min/{1.73_m2} (ref >59)
GFR calc non Af Amer: 135 mL/min/{1.73_m2} (ref >59)
Glucose: 80 mg/dL (ref 65–99)
Potassium: 4.5 mmol/L (ref 3.5–5.2)
Sodium: 138 mmol/L (ref 134–144)

## 2020-02-19 LAB — MAGNESIUM: Magnesium: 1.7 mg/dL (ref 1.6–2.3)

## 2020-02-19 LAB — TSH: TSH: 1.34 u[IU]/mL (ref 0.450–4.500)

## 2020-02-19 NOTE — Patient Instructions (Signed)
Medication Instructions:  Continue same    Lab Work: Bmet,magnesium,tsh today    Testing/Procedures: Schedule Echo   Follow-Up: At [facility], you and your health needs are our priority.  As part of our continuing mission to provide you with exceptional heart care, we have created designated Provider Care Teams.  These Care Teams include your primary Cardiologist (physician) and Advanced Practice Providers (APPs -  Physician Assistants and Nurse Practitioners) who all work together to provide you with the care you need, when you need it.  We recommend signing up for the patient portal called "MyChart".  Sign up information is provided on this After Visit Summary.  MyChart is used to connect with patients for Virtual Visits (Telemedicine).  Patients are able to view lab/test results, encounter notes, upcoming appointments, etc.  Non-urgent messages can be sent to your provider as well.   To learn more about what you can do with MyChart, go to [email or web address].    Your next appointment:  Will be determined after Echo   The format for your next appointment: Office   Provider:  Dr.Jordan

## 2020-02-19 NOTE — Progress Notes (Signed)
Cardiology Office Note   Date:  02/19/2020   ID:  Joan Guerra, DOB 11-30-1990, MRN [id number]  PCP:  Iva Boop, MD  Cardiologist:   Ben Sanz [place], MD   Chief Complaint  Patient presents with   Palpitations      History of Present Illness: Joan Guerra is a 29 y.o. female who is seen at the request of Venia Carbon NP for evaluation of palpitations. She is now [redacted] weeks pregnant. This is her second pregnancy. She had no difficulty with her first pregnancy. She states she has a history of PVC intermittently prior to pregnancy. Now she is experiencing much more frequent palpitations. They are more noticeable at night but may occur during the day. They are occurring daily. No racing but more of a skipping feeling. Drinks 1-1.5 cups of coffee a day. 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 Is active. No dizziness or syncope. No chest pain or dyspnea.   Past Medical History:  Diagnosis Date   Allergy    Anxiety    HSV (herpes simplex virus) anogenital infection    cold sores not genital    Past Surgical History:  Procedure Laterality Date   CHOLECYSTECTOMY       Current Outpatient Medications  Medication Sig Dispense Refill   fluticasone (FLONASE) 50 MCG/ACT nasal spray Place 2 sprays into both nostrils daily for 10 days. 16 g 0   loratadine (CLARITIN) 10 MG tablet Take 10 mg by mouth daily as needed for allergies.     Prenatal Vit-Fe Fumarate-FA (PRENATAL VITAMIN PO) Take by mouth.     valACYclovir (VALTREX) 1000 MG tablet Take 1 tablet (1,000 mg total) by mouth daily. 30 tablet 0   No current facility-administered medications for this visit.    Allergies:   Buspirone and Azithromycin    Social History:  The patient  reports that she has never smoked. She has never used smokeless tobacco. She reports previous alcohol use of about 2.0 standard drinks of alcohol per week. She reports that she does not use drugs.   Family  History:  The patient's family history includes Autoimmune disease in her mother; Cancer in her maternal grandmother; Heart disease in her maternal grandmother, mother, and paternal grandmother; Hyperlipidemia in her father, maternal grandmother, mother, and paternal grandmother; Hypertension in her maternal grandmother, mother, and paternal grandmother.    ROS:  Please see the history of present illness.   Otherwise, review of systems are positive for none.   All other systems are reviewed and negative.    PHYSICAL EXAM: VS:  BP 106/62    Pulse (!) 110    Ht 5\' 5"  (1.651 m)    Wt 168 lb 4.8 oz (76.3 kg)    LMP 07/21/2019    BMI 28.01 kg/m  , BMI Body mass index is 28.01 kg/m. GEN: Well nourished, gravid WF , in no acute distress  HEENT: normal  Neck: no JVD, carotid bruits, or masses Cardiac: RRR; there is a gr 1-2/6 systolic flow murmur LSB. No  rubs, or gallops,no edema  Respiratory:  clear to auscultation bilaterally, normal work of breathing GI: soft, nontender, gravid, + BS MS: no deformity or atrophy  Skin: warm and dry, no rash Neuro:  Strength and sensation are intact Psych: euthymic mood, full affect   EKG:  EKG is ordered today. The ekg ordered today demonstrates Sinus tachycardia. Rate 110. Otherwise normal. I have personally reviewed and interpreted this  study.     Recent Labs: 01/18/2020: Hemoglobin 11.5; Platelets 279    Lipid Panel No results found for: CHOL, TRIG, HDL, CHOLHDL, VLDL, LDLCALC, LDLDIRECT    Wt Readings from Last 3 Encounters:  02/19/20 168 lb 4.8 oz (76.3 kg)  02/16/20 168 lb (76.2 kg)  01/18/20 159 lb 12.8 oz (72.5 kg)      Other studies Reviewed: Additional studies/ records that were reviewed today include: none   ASSESSMENT AND PLAN:  1.  Palpitations. Symptoms are consistent with PVCs which she has had in the past but are much more frequent now most likely due to pregnancy. Will check BMET, magnesium, TSH. Check an Echo. For now would  avoid medication unless absolutely necessary with pregnancy. I expect her symptoms will improve once pregnancy complete. She is to notify me should she develop worsening symptoms.    Current medicines are reviewed at length with the patient today.  The patient does not have concerns regarding medicines.  The following changes have been made:  no change  Labs/ tests ordered today include:   Orders Placed This Encounter  Procedures   Basic metabolic panel   Magnesium   TSH   EKG 12-Lead   ECHOCARDIOGRAM COMPLETE     Disposition:   FU with TBD Signed, Moselle Rister [place], MD  02/19/2020 9:36 AM    [facility] [address], [place], [place], [address] Phone [phone], Fax [phone]

## 2020-03-08 ENCOUNTER — Other Ambulatory Visit: Payer: Self-pay

## 2020-03-08 ENCOUNTER — Telehealth (INDEPENDENT_AMBULATORY_CARE_PROVIDER_SITE_OTHER): Payer: No Typology Code available for payment source | Admitting: Obstetrics and Gynecology

## 2020-03-08 ENCOUNTER — Encounter: Payer: Self-pay | Admitting: Obstetrics and Gynecology

## 2020-03-08 ENCOUNTER — Telehealth: Payer: Self-pay

## 2020-03-08 DIAGNOSIS — Z3A33 33 weeks gestation of pregnancy: Secondary | ICD-10-CM

## 2020-03-08 DIAGNOSIS — O3433 Maternal care for cervical incompetence, third trimester: Secondary | ICD-10-CM

## 2020-03-08 DIAGNOSIS — Z3483 Encounter for supervision of other normal pregnancy, third trimester: Secondary | ICD-10-CM

## 2020-03-08 DIAGNOSIS — O3432 Maternal care for cervical incompetence, second trimester: Secondary | ICD-10-CM

## 2020-03-08 DIAGNOSIS — Z3480 Encounter for supervision of other normal pregnancy, unspecified trimester: Secondary | ICD-10-CM

## 2020-03-08 NOTE — Progress Notes (Signed)
   TELEHEALTH OBSTETRICS VISIT ENCOUNTER NOTE  I connected with Joan Guerra on 03/08/20 at  8:55 AM EDT by telephone at home and verified that I am speaking with the correct person using two identifiers.   I discussed the limitations, risks, security and privacy concerns of performing an evaluation and management service by telephone and the availability of in person appointments. I also discussed with the patient that there may be a patient responsible charge related to this service. The patient expressed understanding and agreed to proceed.  Subjective:  Joan Guerra is a 29 y.o. G2P1001 at [redacted]w[redacted]d being followed for ongoing prenatal care.  She is currently monitored for the following issues for this low-risk pregnancy and has Chronic rhinitis; Mild intermittent asthma without complication; GAD (generalized anxiety disorder); Family history of breast cancer; Supervision of other normal pregnancy, antepartum; H/O cold sores; and Cervical funneling affecting pregnancy on their problem list.  Patient reports no complaints. Reports fetal movement. Denies any contractions, bleeding or leaking of fluid.   The following portions of the patient's history were reviewed and updated as appropriate: allergies, current medications, past family history, past medical history, past social history, past surgical history and problem list.   Objective:   General:  Alert, oriented and cooperative.   Mental Status: Normal mood and affect perceived. Normal judgment and thought content.  Rest of physical exam deferred due to type of encounter  Assessment and Plan:  Pregnancy: G2P1001 at [redacted]w[redacted]d 1. Cervical funneling affecting pregnancy in second trimester  No further issues.  Normal cervical length 01/18/20  2. Supervision of other normal pregnancy, antepartum  Saw cardiology on 02/19/20 for palpitations. Taking magnesium and has noticed improvement.  Has echo scheduled.  On vacation- does not have BP cuff. Will  recheck in the office in 2 weeks.   Preterm labor symptoms and general obstetric precautions including but not limited to vaginal bleeding, contractions, leaking of fluid and fetal movement were reviewed in detail with the patient.  I discussed the assessment and treatment plan with the patient. The patient was provided an opportunity to ask questions and all were answered. The patient agreed with the plan and demonstrated an understanding of the instructions. The patient was advised to call back or seek an in-person office evaluation/go to [facility] at [facility] for any urgent or concerning symptoms. Please refer to After Visit Summary for other counseling recommendations.   I provided 10 minutes of non-face-to-face time during this encounter.  Return in about 2 weeks (around 03/22/2020) for for in person for GBS and GC.  Future Appointments  Date Time Provider Department Center  03/14/2020  8:30 AM [facility] ECHO 4 [id number]    Venia Carbon, NP Center for [employer], [facility]

## 2020-03-08 NOTE — Telephone Encounter (Signed)
Attempted to call pt to begin virtual visit. Pt did not answer. Voicemail left asking pt to return call to office.  

## 2020-03-14 ENCOUNTER — Ambulatory Visit (HOSPITAL_COMMUNITY): Payer: No Typology Code available for payment source | Attending: Cardiovascular Disease

## 2020-03-14 ENCOUNTER — Other Ambulatory Visit: Payer: Self-pay

## 2020-03-14 DIAGNOSIS — I493 Ventricular premature depolarization: Secondary | ICD-10-CM

## 2020-03-14 DIAGNOSIS — Z3A31 31 weeks gestation of pregnancy: Secondary | ICD-10-CM

## 2020-03-14 DIAGNOSIS — R002 Palpitations: Secondary | ICD-10-CM | POA: Diagnosis not present

## 2020-03-22 ENCOUNTER — Encounter: Payer: No Typology Code available for payment source | Admitting: Advanced Practice Midwife

## 2020-03-25 ENCOUNTER — Other Ambulatory Visit: Payer: Self-pay

## 2020-03-25 ENCOUNTER — Ambulatory Visit (INDEPENDENT_AMBULATORY_CARE_PROVIDER_SITE_OTHER): Payer: No Typology Code available for payment source | Admitting: Certified Nurse Midwife

## 2020-03-25 ENCOUNTER — Other Ambulatory Visit (HOSPITAL_COMMUNITY)
Admission: RE | Admit: 2020-03-25 | Discharge: 2020-03-25 | Disposition: A | Discharge status: Home / Self Care | Payer: No Typology Code available for payment source | Source: Ambulatory Visit | Attending: Certified Nurse Midwife | Admitting: Certified Nurse Midwife

## 2020-03-25 VITALS — BP 121/74 | HR 88 | Wt 175.0 lb

## 2020-03-25 DIAGNOSIS — Z3480 Encounter for supervision of other normal pregnancy, unspecified trimester: Secondary | ICD-10-CM | POA: Diagnosis not present

## 2020-03-25 DIAGNOSIS — Z3A35 35 weeks gestation of pregnancy: Secondary | ICD-10-CM

## 2020-03-25 DIAGNOSIS — Z3483 Encounter for supervision of other normal pregnancy, third trimester: Secondary | ICD-10-CM

## 2020-03-25 NOTE — Progress Notes (Signed)
Subjective:  Joan Guerra is a 29 y.o. G2P1001 at [redacted]w[redacted]d being seen today for ongoing prenatal care.  She is currently monitored for the following issues for this low-risk pregnancy and has Chronic rhinitis; Mild intermittent asthma without complication; GAD (generalized anxiety disorder); Family history of breast cancer; Supervision of other normal pregnancy, antepartum; H/O cold sores; and Cervical funneling affecting pregnancy on their problem list.  Patient reports no complaints.  Contractions: Irritability. Vag. Bleeding: None.  Movement: Present. Denies leaking of fluid.   The following portions of the patient's history were reviewed and updated as appropriate: allergies, current medications, past family history, past medical history, past social history, past surgical history and problem list. Problem list updated.  Objective:   Vitals:   03/25/20 1112  BP: 121/74  Pulse: 88  Weight: 175 lb (79.4 kg)    Fetal Status: Fetal Heart Rate (bpm): 146 Fundal Height: 36 cm Movement: Present  Presentation: Vertex  General:  Alert, oriented and cooperative. Patient is in no acute distress.  Skin: Skin is warm and dry. No rash noted.   Cardiovascular: Normal heart rate noted  Respiratory: Normal respiratory effort, no problems with respiration noted  Abdomen: Soft, gravid, appropriate for gestational age. Pain/Pressure: Present     Pelvic: Vag. Bleeding: None Vag D/C Character: Thin   Cervical exam deferred        Extremities: Normal range of motion.  Edema: None  Mental Status: Normal mood and affect. Normal behavior. Normal judgment and thought content.   Urinalysis:      Assessment and Plan:  Pregnancy: G2P1001 at [redacted]w[redacted]d  1. Supervision of other normal pregnancy, antepartum - Culture, beta strep (group b only) - Cervicovaginal ancillary only( [place])  Preterm labor symptoms and general obstetric precautions including but not limited to vaginal bleeding, contractions, leaking of  fluid and fetal movement were reviewed in detail with the patient. Please refer to After Visit Summary for other counseling recommendations.  Return in about 2 weeks (around 04/08/2020).   Donette Larry, CNM

## 2020-03-28 LAB — CULTURE, BETA STREP (GROUP B ONLY)
MICRO NUMBER:: 10686085
SPECIMEN QUALITY:: ADEQUATE

## 2020-03-29 LAB — CERVICOVAGINAL ANCILLARY ONLY
Chlamydia: NEGATIVE
Comment: NEGATIVE
Comment: NORMAL
Neisseria Gonorrhea: NEGATIVE

## 2020-04-08 ENCOUNTER — Ambulatory Visit (INDEPENDENT_AMBULATORY_CARE_PROVIDER_SITE_OTHER): Payer: No Typology Code available for payment source | Admitting: Certified Nurse Midwife

## 2020-04-08 ENCOUNTER — Other Ambulatory Visit: Payer: Self-pay

## 2020-04-08 VITALS — BP 120/77 | HR 85 | Wt 176.0 lb

## 2020-04-08 DIAGNOSIS — O99513 Diseases of the respiratory system complicating pregnancy, third trimester: Secondary | ICD-10-CM

## 2020-04-08 DIAGNOSIS — Z3480 Encounter for supervision of other normal pregnancy, unspecified trimester: Secondary | ICD-10-CM

## 2020-04-08 DIAGNOSIS — J4520 Mild intermittent asthma, uncomplicated: Secondary | ICD-10-CM

## 2020-04-08 DIAGNOSIS — Z3A37 37 weeks gestation of pregnancy: Secondary | ICD-10-CM

## 2020-04-08 NOTE — Progress Notes (Signed)
Subjective:  Joan Guerra is a 29 y.o. G2P1001 at [redacted]w[redacted]d being seen today for ongoing prenatal care.  She is currently monitored for the following issues for this low-risk pregnancy and has Chronic rhinitis; Mild intermittent asthma without complication; GAD (generalized anxiety disorder); Family history of breast cancer; Supervision of other normal pregnancy, antepartum; H/O cold sores; and Cervical funneling affecting pregnancy on their problem list.  Patient reports no complaints.  Contractions: Irritability. Vag. Bleeding: None.  Movement: Present. Denies leaking of fluid.   The following portions of the patient's history were reviewed and updated as appropriate: allergies, current medications, past family history, past medical history, past social history, past surgical history and problem list. Problem list updated.  Objective:   Vitals:   04/08/20 1047  BP: 120/77  Pulse: 85  Weight: 176 lb (79.8 kg)    Fetal Status: Fetal Heart Rate (bpm): 141 Fundal Height: 38 cm Movement: Present  Presentation: Vertex  General:  Alert, oriented and cooperative. Patient is in no acute distress.  Skin: Skin is warm and dry. No rash noted.   Cardiovascular: Normal heart rate noted  Respiratory: Normal respiratory effort, no problems with respiration noted  Abdomen: Soft, gravid, appropriate for gestational age. Pain/Pressure: Present     Pelvic: Vag. Bleeding: None Vag D/C Character: Thin   Cervical exam deferred        Extremities: Normal range of motion.  Edema: None  Mental Status: Normal mood and affect. Normal behavior. Normal judgment and thought content.   Urinalysis:      Assessment and Plan:  Pregnancy: G2P1001 at [redacted]w[redacted]d  1. Supervision of other normal pregnancy, antepartum  Term labor symptoms and general obstetric precautions including but not limited to vaginal bleeding, contractions, leaking of fluid and fetal movement were reviewed in detail with the patient. Please refer to  After Visit Summary for other counseling recommendations.  Return in about 1 week (around 04/15/2020).   Donette Larry, CNM

## 2020-04-15 ENCOUNTER — Ambulatory Visit (INDEPENDENT_AMBULATORY_CARE_PROVIDER_SITE_OTHER): Payer: No Typology Code available for payment source

## 2020-04-15 ENCOUNTER — Other Ambulatory Visit: Payer: Self-pay

## 2020-04-15 VITALS — BP 126/85 | HR 84 | Wt 178.0 lb

## 2020-04-15 DIAGNOSIS — J45909 Unspecified asthma, uncomplicated: Secondary | ICD-10-CM

## 2020-04-15 DIAGNOSIS — O26873 Cervical shortening, third trimester: Secondary | ICD-10-CM

## 2020-04-15 DIAGNOSIS — Z3A38 38 weeks gestation of pregnancy: Secondary | ICD-10-CM

## 2020-04-15 DIAGNOSIS — O99343 Other mental disorders complicating pregnancy, third trimester: Secondary | ICD-10-CM

## 2020-04-15 DIAGNOSIS — O99513 Diseases of the respiratory system complicating pregnancy, third trimester: Secondary | ICD-10-CM

## 2020-04-15 DIAGNOSIS — J310 Chronic rhinitis: Secondary | ICD-10-CM

## 2020-04-15 DIAGNOSIS — F411 Generalized anxiety disorder: Secondary | ICD-10-CM

## 2020-04-15 DIAGNOSIS — Z3480 Encounter for supervision of other normal pregnancy, unspecified trimester: Secondary | ICD-10-CM

## 2020-04-15 NOTE — Progress Notes (Signed)
   PRENATAL VISIT NOTE  Subjective:  Joan Guerra is a 29 y.o. G2P1001 at [redacted]w[redacted]d being seen today for ongoing prenatal care.  She is currently monitored for the following issues for this low-risk pregnancy and has Chronic rhinitis; Mild intermittent asthma without complication; GAD (generalized anxiety disorder); Family history of breast cancer; Supervision of other normal pregnancy, antepartum; H/O cold sores; and Cervical funneling affecting pregnancy on their problem list.  Patient reports no complaints.  Contractions: Irritability. Vag. Bleeding: None.  Movement: Present. Denies leaking of fluid.   The following portions of the patient's history were reviewed and updated as appropriate: allergies, current medications, past family history, past medical history, past social history, past surgical history and problem list.   Objective:   Vitals:   04/15/20 1036  BP: 126/85  Pulse: 84  Weight: 178 lb (80.7 kg)    Fetal Status: Fetal Heart Rate (bpm): 152 Fundal Height: 39 cm Movement: Present  Presentation: Vertex  General:  Alert, oriented and cooperative. Patient is in no acute distress.  Skin: Skin is warm and dry. No rash noted.   Cardiovascular: Normal heart rate noted  Respiratory: Normal respiratory effort, no problems with respiration noted  Abdomen: Soft, gravid, appropriate for gestational age.  Pain/Pressure: Present     Pelvic: Cervical exam performed in the presence of a chaperone Dilation: 1 Effacement (%): 60 Station: -2  Extremities: Normal range of motion.  Edema: None  Mental Status: Normal mood and affect. Normal behavior. Normal judgment and thought content.   Assessment and Plan:  Pregnancy: G2P1001 at [redacted]w[redacted]d 1. Supervision of other normal pregnancy, antepartum - No complaints. Routine care  Term labor symptoms and general obstetric precautions including but not limited to vaginal bleeding, contractions, leaking of fluid and fetal movement were reviewed in detail  with the patient. Please refer to After Visit Summary for other counseling recommendations.   Return in about 1 week (around 04/22/2020) for Return OB visit.  Future Appointments  Date Time Provider Department Center  04/26/2020  9:15 AM Rolm Bookbinder, CNM [facility]    Rolm Bookbinder, [place] 04/15/20 11:20 AM

## 2020-04-15 NOTE — Patient Instructions (Signed)

## 2020-04-17 ENCOUNTER — Inpatient Hospital Stay (HOSPITAL_COMMUNITY): Payer: No Typology Code available for payment source | Admitting: Anesthesiology

## 2020-04-17 ENCOUNTER — Inpatient Hospital Stay (HOSPITAL_COMMUNITY)
Admission: AD | Admit: 2020-04-17 | Discharge: 2020-04-19 | DRG: 806 | Disposition: A | Discharge status: Home / Self Care | Payer: No Typology Code available for payment source | Attending: Obstetrics & Gynecology | Admitting: Obstetrics & Gynecology

## 2020-04-17 ENCOUNTER — Encounter (HOSPITAL_COMMUNITY): Payer: Self-pay | Admitting: Obstetrics & Gynecology

## 2020-04-17 ENCOUNTER — Other Ambulatory Visit: Payer: Self-pay

## 2020-04-17 DIAGNOSIS — O4292 Full-term premature rupture of membranes, unspecified as to length of time between rupture and onset of labor: Secondary | ICD-10-CM | POA: Diagnosis present

## 2020-04-17 DIAGNOSIS — O9832 Other infections with a predominantly sexual mode of transmission complicating childbirth: Secondary | ICD-10-CM | POA: Diagnosis present

## 2020-04-17 DIAGNOSIS — B001 Herpesviral vesicular dermatitis: Secondary | ICD-10-CM | POA: Diagnosis present

## 2020-04-17 DIAGNOSIS — Z3A00 Weeks of gestation of pregnancy not specified: Secondary | ICD-10-CM

## 2020-04-17 DIAGNOSIS — Z3490 Encounter for supervision of normal pregnancy, unspecified, unspecified trimester: Secondary | ICD-10-CM

## 2020-04-17 DIAGNOSIS — O3432 Maternal care for cervical incompetence, second trimester: Secondary | ICD-10-CM

## 2020-04-17 DIAGNOSIS — Z3A38 38 weeks gestation of pregnancy: Secondary | ICD-10-CM | POA: Diagnosis not present

## 2020-04-17 DIAGNOSIS — Z20822 Contact with and (suspected) exposure to covid-19: Secondary | ICD-10-CM | POA: Diagnosis present

## 2020-04-17 DIAGNOSIS — O9952 Diseases of the respiratory system complicating childbirth: Secondary | ICD-10-CM | POA: Diagnosis present

## 2020-04-17 DIAGNOSIS — O329XX0 Maternal care for malpresentation of fetus, unspecified, not applicable or unspecified: Secondary | ICD-10-CM

## 2020-04-17 DIAGNOSIS — J4520 Mild intermittent asthma, uncomplicated: Secondary | ICD-10-CM | POA: Diagnosis present

## 2020-04-17 DIAGNOSIS — Z3480 Encounter for supervision of other normal pregnancy, unspecified trimester: Secondary | ICD-10-CM

## 2020-04-17 HISTORY — DX: Other complications of anesthesia, initial encounter: T88.59XA

## 2020-04-17 LAB — TYPE AND SCREEN
ABO/RH(D): A POS
Antibody Screen: NEGATIVE

## 2020-04-17 LAB — CBC
HCT: 33.1 % — ABNORMAL LOW (ref 36.0–46.0)
Hemoglobin: 10.9 g/dL — ABNORMAL LOW (ref 12.0–15.0)
MCH: 28.5 pg (ref 26.0–34.0)
MCHC: 32.9 g/dL (ref 30.0–36.0)
MCV: 86.4 fL (ref 80.0–100.0)
Platelets: 323 10*3/uL (ref 150–400)
RBC: 3.83 MIL/uL — ABNORMAL LOW (ref 3.87–5.11)
RDW: 13.1 % (ref 11.5–15.5)
WBC: 14.2 10*3/uL — ABNORMAL HIGH (ref 4.0–10.5)
nRBC: 0 % (ref 0.0–0.2)

## 2020-04-17 LAB — POCT FERN TEST: POCT Fern Test: POSITIVE

## 2020-04-17 LAB — SARS CORONAVIRUS 2 BY RT PCR (HOSPITAL ORDER, PERFORMED IN ~~LOC~~ HOSPITAL LAB): SARS Coronavirus 2: NEGATIVE

## 2020-04-17 MED ORDER — OXYTOCIN-SODIUM CHLORIDE 30-0.9 UT/500ML-% IV SOLN
2.5000 [IU]/h | INTRAVENOUS | Status: DC
  Administered 2020-04-18 (×2): 2.5 [IU]/h via INTRAVENOUS
  Filled 2020-04-17: qty 500

## 2020-04-17 MED ORDER — TERBUTALINE SULFATE 1 MG/ML IJ SOLN: 0.2500 mg | Freq: Once | INTRAMUSCULAR | Status: DC | PRN

## 2020-04-17 MED ORDER — LIDOCAINE HCL (PF) 1 % IJ SOLN
INTRAMUSCULAR | Status: DC | PRN
  Administered 2020-04-17: 1 mL via EPIDURAL
  Administered 2020-04-17: 5 mL via EPIDURAL

## 2020-04-17 MED ORDER — SOD CITRATE-CITRIC ACID 500-334 MG/5ML PO SOLN: 30.0000 mL | ORAL | Status: DC | PRN

## 2020-04-17 MED ORDER — FENTANYL-BUPIVACAINE-NACL 0.5-0.125-0.9 MG/250ML-% EP SOLN
EPIDURAL | Status: AC
  Filled 2020-04-17: qty 250

## 2020-04-17 MED ORDER — EPHEDRINE 5 MG/ML INJ: 10.0000 mg | INTRAVENOUS | Status: DC | PRN

## 2020-04-17 MED ORDER — PHENYLEPHRINE 40 MCG/ML (10ML) SYRINGE FOR IV PUSH (FOR BLOOD PRESSURE SUPPORT)
80.0000 ug | PREFILLED_SYRINGE | INTRAVENOUS | Status: DC | PRN
  Filled 2020-04-17 (×2): qty 10

## 2020-04-17 MED ORDER — DIPHENHYDRAMINE HCL 50 MG/ML IJ SOLN: 12.5000 mg | INTRAMUSCULAR | Status: DC | PRN

## 2020-04-17 MED ORDER — OXYCODONE-ACETAMINOPHEN 5-325 MG PO TABS: 1.0000 | ORAL_TABLET | ORAL | Status: DC | PRN

## 2020-04-17 MED ORDER — EPHEDRINE SULFATE 50 MG/ML IJ SOLN
INTRAMUSCULAR | Status: DC | PRN
  Administered 2020-04-17: 5 mg via INTRAVENOUS

## 2020-04-17 MED ORDER — ACETAMINOPHEN 325 MG PO TABS: 650.0000 mg | ORAL_TABLET | ORAL | Status: DC | PRN

## 2020-04-17 MED ORDER — OXYTOCIN BOLUS FROM INFUSION
333.0000 mL | Freq: Once | INTRAVENOUS | Status: AC
  Administered 2020-04-18: 333 mL via INTRAVENOUS

## 2020-04-17 MED ORDER — ONDANSETRON HCL 4 MG/2ML IJ SOLN
4.0000 mg | Freq: Four times a day (QID) | INTRAMUSCULAR | Status: DC | PRN
  Filled 2020-04-17: qty 2

## 2020-04-17 MED ORDER — OXYCODONE-ACETAMINOPHEN 5-325 MG PO TABS: 2.0000 | ORAL_TABLET | ORAL | Status: DC | PRN

## 2020-04-17 MED ORDER — FENTANYL-BUPIVACAINE-NACL 0.5-0.125-0.9 MG/250ML-% EP SOLN: 12.0000 mL/h | EPIDURAL | Status: DC | PRN

## 2020-04-17 MED ORDER — LIDOCAINE HCL (PF) 1 % IJ SOLN: 30.0000 mL | INTRAMUSCULAR | Status: DC | PRN

## 2020-04-17 MED ORDER — OXYTOCIN-SODIUM CHLORIDE 30-0.9 UT/500ML-% IV SOLN: 1.0000 m[IU]/min | INTRAVENOUS | Status: DC

## 2020-04-17 MED ORDER — LACTATED RINGERS IV SOLN
500.0000 mL | Freq: Once | INTRAVENOUS | Status: AC
  Administered 2020-04-17: 500 mL via INTRAVENOUS

## 2020-04-17 MED ORDER — PHENYLEPHRINE 40 MCG/ML (10ML) SYRINGE FOR IV PUSH (FOR BLOOD PRESSURE SUPPORT)
80.0000 ug | PREFILLED_SYRINGE | INTRAVENOUS | Status: DC | PRN
  Administered 2020-04-17: 80 ug via INTRAVENOUS

## 2020-04-17 MED ORDER — EPHEDRINE 5 MG/ML INJ
10.0000 mg | INTRAVENOUS | Status: DC | PRN
  Filled 2020-04-17: qty 10

## 2020-04-17 MED ORDER — LACTATED RINGERS IV SOLN: 500.0000 mL | INTRAVENOUS | Status: DC | PRN

## 2020-04-17 MED ORDER — SODIUM CHLORIDE (PF) 0.9 % IJ SOLN
INTRAMUSCULAR | Status: DC | PRN
  Administered 2020-04-17: 10 mL/h via EPIDURAL

## 2020-04-17 MED ORDER — PHENYLEPHRINE HCL (PRESSORS) 10 MG/ML IV SOLN
INTRAVENOUS | Status: DC | PRN
  Administered 2020-04-17 (×2): 40 ug via INTRAVENOUS
  Administered 2020-04-17: 80 ug via INTRAVENOUS
  Administered 2020-04-17 (×3): 40 ug via INTRAVENOUS

## 2020-04-17 MED ORDER — LACTATED RINGERS IV SOLN: INTRAVENOUS | Status: DC

## 2020-04-17 NOTE — Progress Notes (Signed)
Pt informed that the ultrasound is considered a limited OB ultrasound and is not intended to be a complete ultrasound exam.  Patient also informed that the ultrasound is not being completed with the intent of assessing for fetal or placental anomalies or any pelvic abnormalities.  Explained that the purpose of today's ultrasound is to assess for  presentation.  Patient acknowledges the purpose of the exam and the limitations of the study.    Cephalic  Ransome Helwig, NP   

## 2020-04-17 NOTE — Anesthesia Procedure Notes (Signed)
Epidural Patient location during procedure: OB Start time: 04/17/2020 8:50 PM End time: 04/17/2020 9:00 PM  Staffing Anesthesiologist: Lannie Fields, DO Performed: anesthesiologist   Preanesthetic Checklist Completed: patient identified, IV checked, risks and benefits discussed, monitors and equipment checked, pre-op evaluation and timeout performed  Epidural Patient position: sitting Prep: DuraPrep and site prepped and draped Patient monitoring: continuous pulse ox, blood pressure, heart rate and cardiac monitor Approach: midline Location: L3-L4 Injection technique: LOR air  Needle:  Needle type: Tuohy  Needle gauge: 17 G Needle length: 9 cm Needle insertion depth: 5 cm Catheter type: closed end flexible Catheter size: 19 Gauge Catheter at skin depth: 10 cm Test dose: negative  Assessment Sensory level: T8 Events: blood not aspirated, injection not painful, no injection resistance, no paresthesia and negative IV test  Additional Notes Patient identified. Risks/Benefits/Options discussed with patient including but not limited to bleeding, infection, nerve damage, paralysis, failed block, incomplete pain control, headache, blood pressure changes, nausea, vomiting, reactions to medication both or allergic, itching and postpartum back pain. Confirmed with bedside nurse the patient's most recent platelet count. Confirmed with patient that they are not currently taking any anticoagulation, have any bleeding history or any family history of bleeding disorders. Patient expressed understanding and wished to proceed. All questions were answered. Sterile technique was used throughout the entire procedure. Please see nursing notes for vital signs. Test dose was given through epidural catheter and negative prior to continuing to dose epidural or start infusion. Warning signs of high block given to the patient including shortness of breath, tingling/numbness in hands, complete motor block,  or any concerning symptoms with instructions to call for help. Patient was given instructions on fall risk and not to get out of bed. All questions and concerns addressed with instructions to call with any issues or inadequate analgesia.  Reason for block:procedure for pain

## 2020-04-17 NOTE — Progress Notes (Signed)
Rafaella Kole is a 29 y.o. G2P1001 at [redacted]w[redacted]d admitted for PROM  Subjective:  Patient feeling some contractions. Using nitrous. She is anxious about labor and an epidural due to the issues with last epidural.   Objective: BP (!) 107/92 (BP Location: Left Arm)   Pulse 103   Temp 98.6 F (37 C) (Axillary)   Resp 18   Ht 5\' 5"  (1.651 m)   Wt 79.1 kg   LMP 07/21/2019   SpO2 100%   BMI 29.01 kg/m  No intake/output data recorded. No intake/output data recorded.  FHT:  FHR: 150 bpm, variability: moderate,  accelerations:  Present,  decelerations:  Absent UC:   irregular, every 2-6 minutes SVE:    deferred at this time.   Labs: Lab Results  Component Value Date   WBC 14.2 (H) 04/17/2020   HGB 10.9 (L) 04/17/2020   HCT 33.1 (L) 04/17/2020   MCV 86.4 04/17/2020   PLT 323 04/17/2020    Assessment / Plan: PROM, still in latent labor   Labor: latent labor Preeclampsia:  NA Fetal Wellbeing:  Category I Pain Control:  Nitrous Oxide I/D:  n/a Anticipated MOD:  NSVD   DW patient options at this time including expectant management, augmentation with pitocin, FB or cytotec or nipple stimulation. Patient would like to try nipple stimulation at this time. Also recommended that patient speak with anesthesia prior to being really uncomfortable about her last experience. She states that when the halved the dose of the epidural she felt much better, and would like to discuss starting at half this time. Will have someone from anesthesia come see the patient.   06/17/2020 04/17/2020, 5:54 PM

## 2020-04-17 NOTE — MAU Note (Signed)
Joan Guerra is a 29 y.o. at [redacted]w[redacted]d here in MAU reporting: had a trickle around 0200. Still leaking, is wearing a pad. Fluid has been clear and light yellow. Has been having some contractions, but states they are not consistent. No bleeding. +FM  Onset of complaint: today  Pain score: 5/10  Vitals:   04/17/20 1211  BP: 122/81  Pulse: 99  Resp: 16  Temp: 98.6 F (37 C)  SpO2: 100%     FHT: +FM  Lab orders placed from triage: none

## 2020-04-17 NOTE — H&P (Addendum)
OBSTETRIC ADMISSION HISTORY AND PHYSICAL  Joan Guerra is a 29 y.o. female G2P1001 with IUP at [redacted]w[redacted]d by LMP and 19wk [place] presenting for PROM. She reports +FMs, No LOF, no VB, no blurry vision, headaches or peripheral edema, and RUQ pain.  She plans on breast feeding. She is planning for her husband to get a vasectomy for birth control. She received her prenatal care at  [facility]    Dating: By LMP --->  Estimated Date of Delivery: 04/26/20  Sono:  @[redacted]w[redacted]d , CWD, normal anatomy, cephalic presentation, 291g, 70% EFW   Prenatal History/Complications: Hx of oral HSV (no genital lesions ever) Cervical funneling in the second trimester, normal cervical length    Past Medical History: Past Medical History:  Diagnosis Date   Allergy    Anxiety    HSV (herpes simplex virus) anogenital infection    cold sores not genital    Past Surgical History: Past Surgical History:  Procedure Laterality Date   CHOLECYSTECTOMY      Obstetrical History: OB History     Gravida  2   Para  1   Term  1   Preterm      AB      Living  1      SAB      TAB      Ectopic      Multiple  0   Live Births  1           Social History: Social History   Socioeconomic History   Marital status: Married    Spouse name: Not on file   Number of children: 1   Years of education: Not on file   Highest education level: Not on file  Occupational History   Not on file  Tobacco Use   Smoking status: Never Smoker   Smokeless tobacco: Never Used  Vaping Use   Vaping Use: Never used  Substance and Sexual Activity   Alcohol use: Not Currently    Alcohol/week: 2.0 standard drinks    Types: 2 Standard drinks or equivalent per week   Drug use: No   Sexual activity: Yes    Birth control/protection: None    Comment: last intercourse last night   Other Topics Concern   Not on file  Social History Narrative   Not on file   Social Determinants of Health   Financial Resource Strain:     Difficulty of Paying Living Expenses:   Food Insecurity:    Worried About in the Last Year:    Programme researcher, broadcasting/film/video in the Last Year:   Transportation Needs:    Barista (Medical):    Lack of Transportation (Non-Medical):   Physical Activity:    Days of Exercise per Week:    Minutes of Exercise per Session:   Stress:    Feeling of Stress :   Social Connections:    Frequency of Communication with Friends and Family:    Frequency of Social Gatherings with Friends and Family:    Attends Religious Services:    Active Member of Clubs or Organizations:    Attends Freight forwarder:    Marital Status:     Family History: Family History  Problem Relation Age of Onset   Hyperlipidemia Mother    Hypertension Mother    Autoimmune disease Mother    Heart disease Mother        stent, PVCs   Hyperlipidemia Father    Cancer  Maternal Grandmother    Heart disease Maternal Grandmother    Hyperlipidemia Maternal Grandmother    Hypertension Maternal Grandmother    Heart disease Paternal Grandmother    Hyperlipidemia Paternal Grandmother    Hypertension Paternal Grandmother     Allergies: Allergies  Allergen Reactions   Buspirone Nausea And Vomiting and Nausea Only    And double vision.   Azithromycin Rash    Medications Prior to Admission  Medication Sig Dispense Refill Last Dose   fluticasone (FLONASE) 50 MCG/ACT nasal spray Place 2 sprays into both nostrils daily for 10 days. 16 g 0    loratadine (CLARITIN) 10 MG tablet Take 10 mg by mouth daily as needed for allergies.      Prenatal Vit-Fe Fumarate-FA (PRENATAL VITAMIN PO) Take by mouth.      valACYclovir (VALTREX) 1000 MG tablet Take 1 tablet (1,000 mg total) by mouth daily. 30 tablet 0      Review of Systems   All systems reviewed and negative except as stated in HPI  Blood pressure 122/81, pulse 99, temperature 98.6 F (37 C), temperature source Oral, resp. rate 16, height 5\' 5"   (1.651 m), weight 79.1 kg, last menstrual period 07/21/2019, SpO2 100 %. General appearance: alert, cooperative and appears stated age Lungs: clear to auscultation bilaterally Heart: regular rate and rhythm Abdomen: soft, non-tender; bowel sounds normal Pelvic: normal external exam. No evidence of lesions. Presentation: cephalic Fetal monitoringBaseline: 150 bpm, Variability: Good {> 6 bpm), Accelerations: Reactive and Decelerations: Absent Uterine activity infrequent     Prenatal labs: ABO, Rh: A/RH(D) POSITIVE/-- (01/13 0927) Antibody: NO ANTIBODIES DETECTED (01/13 0927) Rubella: 2.55 (01/13 0927) RPR: NON-REACTIVE (05/03 0857)  HBsAg: NON-REACTIVE (01/13 0927)  HIV: NON-REACTIVE (05/03 0857)  GBS:    1 hr Glucola negative Genetic screening  declined Anatomy 08-29-2006 normal  Prenatal Transfer Tool  Maternal Diabetes: No Genetic Screening: Declined Maternal Ultrasounds/Referrals: Normal Fetal Ultrasounds or other Referrals:  None Maternal Substance Abuse:  No Significant Maternal Medications:  None Significant Maternal Lab Results: Group B Strep negative  Results for orders placed or performed during the hospital encounter of 04/17/20 (from the past 24 hour(s))  POCT fern test   Collection Time: 04/17/20 12:40 PM  Result Value Ref Range   POCT Fern Test Positive = ruptured amniotic membanes     Patient Active Problem List   Diagnosis Date Noted   Cervical funneling affecting pregnancy 01/20/2020   Supervision of other normal pregnancy, antepartum 09/30/2019   H/O cold sores 09/30/2019   Family history of breast cancer 11/05/2018   GAD (generalized anxiety disorder) 07/24/2018   Chronic rhinitis 07/09/2018   Mild intermittent asthma without complication 07/09/2018    Assessment/Plan:  Joan Guerra is a 29 y.o. G2P1001 at [redacted]w[redacted]d here for PROM  #Labor: Expectant management for now. Will recheck in 4 hours and consider augmentation. #Pain: interested in Nitro. Bad  experience with epidural previously #FWB: Cat I #ID: GBS negative #Hx oral HSV: Not taking acyclovir. No pelvic lesions ever. no genital lesions on exam. #MOF: breast #MOC:husband to get vasectomy #Circ:  yes  [redacted]w[redacted]d, MD  04/17/2020, 12:43 PM  Attestation of Supervision of Student:  I confirm that I have verified the information documented in the  resident's  note and that I have also personally performed the history, physical exam and all medical decision making activities.  I have verified that all services and findings are accurately documented in this student's note; and I agree with management and plan  as outlined in the documentation. I have also made any necessary editorial changes.  Sheila Oats, MD Center for [facility], [facility] 04/17/2020 4:57 PM

## 2020-04-17 NOTE — Anesthesia Preprocedure Evaluation (Addendum)
Anesthesia Evaluation  Patient identified by MRN, date of birth, ID band Patient awake    Reviewed: Allergy & Precautions, Patient's Chart, lab work & pertinent test results  History of Anesthesia Complications (+) history of anesthetic complications (intravascular catheter? with 1st delivery)  Airway Mallampati: II  TM Distance: >3 FB Neck ROM: Full    Dental no notable dental hx.    Pulmonary neg pulmonary ROS,    Pulmonary exam normal breath sounds clear to auscultation       Cardiovascular negative cardio ROS Normal cardiovascular exam Rhythm:Regular Rate:Normal     Neuro/Psych PSYCHIATRIC DISORDERS Anxiety negative neurological ROS     GI/Hepatic negative GI ROS, Neg liver ROS,   Endo/Other  negative endocrine ROS  Renal/GU negative Renal ROS  negative genitourinary   Musculoskeletal negative musculoskeletal ROS (+)   Abdominal   Peds negative pediatric ROS (+)  Hematology negative hematology ROS (+)   Anesthesia Other Findings   Reproductive/Obstetrics (+) Pregnancy G2P1, 1 prior epidural- looks like first catheter may have been intravascular but was replaced without issue                            Anesthesia Physical Anesthesia Plan  ASA: II and emergent  Anesthesia Plan: Epidural   Post-op Pain Management:    Induction:   PONV Risk Score and Plan: 2  Airway Management Planned: Natural Airway  Additional Equipment: None  Intra-op Plan:   Post-operative Plan:   Informed Consent: I have reviewed the patients History and Physical, chart, labs and discussed the procedure including the risks, benefits and alternatives for the proposed anesthesia with the patient or authorized representative who has indicated his/her understanding and acceptance.       Plan Discussed with:   Anesthesia Plan Comments:         Anesthesia Quick Evaluation

## 2020-04-18 ENCOUNTER — Encounter (HOSPITAL_COMMUNITY): Payer: Self-pay | Admitting: Obstetrics & Gynecology

## 2020-04-18 DIAGNOSIS — Z3A38 38 weeks gestation of pregnancy: Secondary | ICD-10-CM

## 2020-04-18 LAB — CBC
HCT: 30.4 % — ABNORMAL LOW (ref 36.0–46.0)
Hemoglobin: 10.2 g/dL — ABNORMAL LOW (ref 12.0–15.0)
MCH: 29.2 pg (ref 26.0–34.0)
MCHC: 33.6 g/dL (ref 30.0–36.0)
MCV: 87.1 fL (ref 80.0–100.0)
Platelets: 295 10*3/uL (ref 150–400)
RBC: 3.49 MIL/uL — ABNORMAL LOW (ref 3.87–5.11)
RDW: 13.2 % (ref 11.5–15.5)
WBC: 22.5 10*3/uL — ABNORMAL HIGH (ref 4.0–10.5)
nRBC: 0 % (ref 0.0–0.2)

## 2020-04-18 LAB — RPR: RPR Ser Ql: NONREACTIVE

## 2020-04-18 MED ORDER — DIPHENHYDRAMINE HCL 25 MG PO CAPS: 25.0000 mg | ORAL_CAPSULE | Freq: Four times a day (QID) | ORAL | Status: DC | PRN

## 2020-04-18 MED ORDER — COCONUT OIL OIL
1.0000 "application " | TOPICAL_OIL | Status: DC | PRN
  Administered 2020-04-18: 1 via TOPICAL

## 2020-04-18 MED ORDER — PRENATAL MULTIVITAMIN CH
1.0000 | ORAL_TABLET | Freq: Every day | ORAL | Status: DC
  Administered 2020-04-18 – 2020-04-19 (×2): 1 via ORAL
  Filled 2020-04-18 (×2): qty 1

## 2020-04-18 MED ORDER — TETANUS-DIPHTH-ACELL PERTUSSIS 5-2.5-18.5 LF-MCG/0.5 IM SUSP: 0.5000 mL | Freq: Once | INTRAMUSCULAR | Status: DC

## 2020-04-18 MED ORDER — ONDANSETRON HCL 4 MG/2ML IJ SOLN: 4.0000 mg | INTRAMUSCULAR | Status: DC | PRN

## 2020-04-18 MED ORDER — SENNOSIDES-DOCUSATE SODIUM 8.6-50 MG PO TABS
2.0000 | ORAL_TABLET | ORAL | Status: DC
  Administered 2020-04-19: 2 via ORAL
  Filled 2020-04-18: qty 2

## 2020-04-18 MED ORDER — SIMETHICONE 80 MG PO CHEW: 80.0000 mg | CHEWABLE_TABLET | ORAL | Status: DC | PRN

## 2020-04-18 MED ORDER — IBUPROFEN 600 MG PO TABS
600.0000 mg | ORAL_TABLET | Freq: Four times a day (QID) | ORAL | Status: DC
  Administered 2020-04-18 – 2020-04-19 (×6): 600 mg via ORAL
  Filled 2020-04-18 (×6): qty 1

## 2020-04-18 MED ORDER — WITCH HAZEL-GLYCERIN EX PADS: 1.0000 "application " | MEDICATED_PAD | CUTANEOUS | Status: DC | PRN

## 2020-04-18 MED ORDER — ZOLPIDEM TARTRATE 5 MG PO TABS: 5.0000 mg | ORAL_TABLET | Freq: Every evening | ORAL | Status: DC | PRN

## 2020-04-18 MED ORDER — BENZOCAINE-MENTHOL 20-0.5 % EX AERO
1.0000 "application " | INHALATION_SPRAY | CUTANEOUS | Status: DC | PRN
  Administered 2020-04-18: 1 via TOPICAL
  Filled 2020-04-18: qty 56

## 2020-04-18 MED ORDER — ACETAMINOPHEN 325 MG PO TABS
650.0000 mg | ORAL_TABLET | ORAL | Status: DC | PRN
  Administered 2020-04-18 (×2): 650 mg via ORAL
  Filled 2020-04-18 (×2): qty 2

## 2020-04-18 MED ORDER — ONDANSETRON HCL 4 MG PO TABS: 4.0000 mg | ORAL_TABLET | ORAL | Status: DC | PRN

## 2020-04-18 MED ORDER — DIBUCAINE (PERIANAL) 1 % EX OINT: 1.0000 "application " | TOPICAL_OINTMENT | CUTANEOUS | Status: DC | PRN

## 2020-04-18 NOTE — Progress Notes (Signed)
Post Partum Day 0 Subjective: no complaints, voiding, tolerating PO and nursing well  Objective: Blood pressure (!) 101/63, pulse 73, temperature 97.9 F (36.6 C), temperature source Oral, resp. rate 16, height 5\' 5"  (1.651 m), weight 174 lb 4.8 oz (79.1 kg), last menstrual period 07/21/2019, SpO2 99 %, unknown if currently breastfeeding.  Physical Exam:  General: alert, cooperative and no distress Lochia: appropriate Uterine Fundus: firm Incision: None DVT Evaluation: No evidence of DVT seen on physical exam. Negative Homan's sign. No cords or calf tenderness. No significant calf/ankle edema.  Recent Labs    04/17/20 1249 04/18/20 0612  HGB 10.9* 10.2*  HCT 33.1* 30.4*    Assessment/Plan: Plan for discharge tomorrow, Breastfeeding, Circumcision prior to discharge and Contraception will be a vascetomy for FOB   LOS: 1 day   06/18/20, CNM, MSN, [facility] 04/18/20 9:57 AM

## 2020-04-18 NOTE — Lactation Note (Signed)
This note was copied from a baby's chart. Lactation Consultation Note  Patient Name: Joan Guerra [id number] Date: 04/18/2020 Reason for consult: Follow-up assessment;Early term 37-38.6wks P2, 58 ETI female infant. Per mom, she BF her [age] year old daughter for 6 months. Per mom, she has limit hand mobility on her right arm due to having it broken 7 years ago. When LC entered room, mom had infant latched on her left breast using the cradle hold, infant was latched on mom's nipple tip and mom was complaining of pain. LC notice abrasion on mom's right nipple but not the left nipple. LC alerted RN who will give mom coconut oil and mom understands to use coconut oil and EBM on breast to help with nipple soreness and more importantly to use correct latch and position of infant at the breast.  Per mom, she can use right hand to support breast using the football hold. Mom broke the latch, mom re-latched infant using the football hold position, LC ask mom to support breast, bring infant towards her chest wall and bring chin first, mouth open wide with tongue down. Per mom, she only feeling a tug and no pain with this latch, infant has rthymitic suckling pattern with swallows and was still BF after 10 minutes when LC left the room. Mom knows to BF by cues, on demand, 8 to 12+ times within 24 hours. Mom knows to call RN or LC if she needs further assistance with latching infant at breast.   Maternal Data Formula Feeding for Exclusion: No Has patient been taught Hand Expression?: Yes Does the patient have breastfeeding experience prior to this delivery?: Yes  Feeding    LATCH Score Latch: Grasps breast easily, tongue down, lips flanged, rhythmical sucking.  Audible Swallowing: Spontaneous and intermittent  Type of Nipple: Everted at rest and after stimulation  Comfort (Breast/Nipple): Filling, red/small blisters or bruises, mild/mod discomfort (abraison on right side of mom's breast only)  Hold  (Positioning): Assistance needed to correctly position infant at breast and maintain latch.  LATCH Score: 8  Interventions Interventions: Assisted with latch;Skin to skin;Breast massage;Hand express;Position options;Support pillows;Adjust position;Breast compression  Lactation Tools Discussed/Used WIC Program: No   Consult Status Consult Status: Follow-up Date: 04/19/20 Follow-up type: In-patient    Joan Guerra 04/18/2020, 4:11 PM

## 2020-04-18 NOTE — Progress Notes (Signed)
Labor Progress Note Joan Guerra is a 29 y.o. G2P1001 at [redacted]w[redacted]d presented for SROM  S:  Patient attempting to getting comfortable with epidural  O:  BP 95/74   Pulse 89   Temp 99.1 F (37.3 C) (Oral)   Resp 18   Ht 5\' 5"  (1.651 m)   Wt 79.1 kg   LMP 07/21/2019   SpO2 100%   BMI 29.01 kg/m   Fetal Tracing:  Baseline: 130 Variability: moderate Accels: 15x15 Decels: none  Toco: 3-5   CVE: 6/100/0   A&P: 29 y.o. G2P1001 [redacted]w[redacted]d SROM #Labor: Progressing well. Continue expectant management while patient gets comfortable with epidural #Pain: epidural #FWB: Cat 1 #GBS negative  [redacted]w[redacted]d, CNM

## 2020-04-18 NOTE — Progress Notes (Signed)
[name] was referred for history of depression/anxiety. * Referral screened out by Clinical Social Worker because none of the following criteria appear to apply: ~ History of anxiety/depression during this pregnancy, or of post-partum depression following prior delivery. ~ Diagnosis of anxiety and/or depression within last 3 years. Per further chart review, it appears that [name] was diagnosed with anxiety in 2016 if not prior. Per last noted from CSW, [name] reported being diagnosed in 2012 while in college.  OR * [name]'s symptoms currently being treated with medication and/or therapy   . Please contact the Clinical Social Worker if needs arise, by [facility] request, or if [name] scores greater than 9/yes to question 10 on Edinburgh Postpartum Depression Screen.   Claude Manges Alec Jaros, MSW, LCSW [facility] [place] [phone]

## 2020-04-18 NOTE — Progress Notes (Signed)
Labor Progress Note Joan Guerra is a 29 y.o. G2P1001 at [redacted]w[redacted]d presented for SROM  S:  Patient resting comfortably  O:  BP 95/74   Pulse 89   Temp 99.1 F (37.3 C) (Oral)   Resp 18   Ht 5\' 5"  (1.651 m)   Wt 79.1 kg   LMP 07/21/2019   SpO2 100%   BMI 29.01 kg/m   Fetal Tracing:  Baseline: 130 Variability: moderate Accels: 15x15 Decels: none  Toco: 2-3   CVE: Dilation: 10 Dilation Complete Date: 04/18/20 Dilation Complete Time: 0048 Effacement (%): 100 Cervical Position: Posterior, Middle Station: Plus 1 Presentation: Vertex Exam by:: Penny Frisbie neil cnm   A&P: 29 y.o. G2P1001 [redacted]w[redacted]d SROM #Labor: Progressing well. Will labor down and start pushing in 1 hour #Pain: epidural #FWB: Cat 1 #GBS negative   [redacted]w[redacted]d, CNM 1:10 AM

## 2020-04-18 NOTE — Lactation Note (Signed)
This note was copied from a baby's chart. Lactation Consultation Note  Patient Name: Joan Guerra [id number] Date: 04/18/2020 Reason for consult: Initial assessment;Early term 64-38.6wks  P2 mother whose infant is now 70 hours old.  This is an ETI at 38+6 weeks.  Mother breast fed her first child (now 2 1/28 years old) for 7 months.  Baby was swaddled and asleep in the bassinet when I arrived.  Reviewed breast feeding basics with family.  Mother is interested in having a  lactation consultant help her with different breast feeding positions.  She is interested in learning more and plans to call for latch assistance when baby awakens.  Informed her that I would be happy to assist as needed.  She will feed 8-12 times/24 hours or sooner if baby shows feeding cues.  Mother is familiar with feeding cues and hand expression.  Provided a colostrum container for any EBM she obtains with hand expression.  Milk storage times discussed and finger feeding demonstrated.  Mother will be returning to work after 12 weeks and has 2 DEBPs for home use.  Father is a Producer, television/film/video but was told he could not obtain a DEBP.  Asked him to follow through with this because, as a [employer] employee, he is eligible to receive a Medela DEBP from the hospital.  Father will follow through with this.  Mother will call for latch assistance when baby awakens.   Maternal Data Formula Feeding for Exclusion: No Has patient been taught Hand Expression?: Yes Does the patient have breastfeeding experience prior to this delivery?: Yes  Feeding Feeding Type: Breast Fed  LATCH Score                   Interventions    Lactation Tools Discussed/Used WIC Program: No   Consult Status Consult Status: Follow-up Date: 04/19/20 Follow-up type: In-patient    Dora Sims 04/18/2020, 12:33 PM

## 2020-04-18 NOTE — Discharge Summary (Signed)
Postpartum Discharge Summary    Patient Name: Joan Guerra DOB: 1991-01-21 MRN: [id number]  Date of admission: 04/17/2020 Delivery date:04/18/2020  Delivering provider: Wende Mott  Date of discharge: 04/19/2020  Admitting diagnosis: Term pregnancy [Z34.90] Intrauterine pregnancy: [redacted]w[redacted]d    Secondary diagnosis:  Active Problems:   Term pregnancy  Additional problems:   Discharge diagnosis: Term Pregnancy Delivered                                              Post partum procedures:N/A Augmentation: N/A Complications: None  Hospital course: Onset of Labor With Vaginal Delivery      29y.o. yo G2P1001 at 3104w6das admitted in Latent Labor on 04/17/2020. Patient had an uncomplicated labor course as follows:  Membrane Rupture Time/Date:  ,   Delivery Method:Vaginal, Spontaneous  Episiotomy: None  Lacerations:  None  Patient had an uncomplicated postpartum course.  She is ambulating, tolerating a regular diet, passing flatus, and urinating well. Patient is discharged home in stable condition on 04/19/20.  Newborn Data: Birth date:04/18/2020  Birth time:2:43 AM  Gender:Female  Living status:Living  Apgars:9 ,9  Weight:   Magnesium Sulfate received: No BMZ received: No Rhophylac:N/A MMR:N/A T-DaP:Given prenatally Flu: N/A Transfusion:No  Physical exam  Vitals:   04/18/20 0031 04/18/20 0210 04/18/20 0256 04/18/20 0318  BP: 95/74  126/71 107/70  Pulse: 89  100 83  Resp:   15 16  Temp:  98.6 F (37 C)    TempSrc:  Oral    SpO2: 100%     Weight:      Height:       General: alert, cooperative and no distress Lochia: appropriate Uterine Fundus: firm Incision: N/A DVT Evaluation: No evidence of DVT seen on physical exam. Labs: Lab Results  Component Value Date   WBC 14.2 (H) 04/17/2020   HGB 10.9 (L) 04/17/2020   HCT 33.1 (L) 04/17/2020   MCV 86.4 04/17/2020   PLT 323 04/17/2020   CMP Latest Ref Rng & Units 02/19/2020  Glucose 65 - 99 mg/dL 80  BUN 6 - 20  mg/dL 7  Creatinine 0.57 - 1.00 mg/dL 0.48(L)  Sodium 134 - 144 mmol/L 138  Potassium 3.5 - 5.2 mmol/L 4.5  Chloride 96 - 106 mmol/L 102  CO2 20 - 29 mmol/L 21  Calcium 8.7 - 10.2 mg/dL 9.2  Total Protein 6.5 - 8.1 g/dL -  Total Bilirubin 0.3 - 1.2 mg/dL -  Alkaline Phos 38 - 126 U/L -  AST 15 - 41 U/L -  ALT 0 - 44 U/L -   Edinburgh Score: Edinburgh Postnatal Depression Scale Screening Tool 08/31/2017  I have been able to laugh and see the funny side of things. 0  I have looked forward with enjoyment to things. 0  I have blamed myself unnecessarily when things went wrong. 1  I have been anxious or worried for no good reason. 1  I have felt scared or panicky for no good reason. 0  Things have been getting on top of me. 0  I have been so unhappy that I have had difficulty sleeping. 0  I have felt sad or miserable. 0  I have been so unhappy that I have been crying. 0  The thought of harming myself has occurred to me. 0  Edinburgh Postnatal Depression Scale Total 2  After visit meds:  Allergies as of 04/19/2020      Reactions   Buspirone Nausea And Vomiting, Nausea Only   And double vision.   Azithromycin Rash      Medication List    TAKE these medications   acetaminophen 325 MG tablet Commonly known as: Tylenol Take 2 tablets (650 mg total) by mouth every 4 (four) hours as needed (for pain scale < 4).   ibuprofen 600 MG tablet Commonly known as: ADVIL Take 1 tablet (600 mg total) by mouth every 6 (six) hours.   loratadine 10 MG tablet Commonly known as: CLARITIN Take 10 mg by mouth daily as needed for allergies.   PRENATAL VITAMIN PO Take 1 tablet by mouth daily.   simethicone 80 MG chewable tablet Commonly known as: MYLICON Chew 1 tablet (80 mg total) by mouth as needed for flatulence.   valACYclovir 1000 MG tablet Commonly known as: VALTREX Take 1 tablet (1,000 mg total) by mouth daily. What changed:   when to take this  reasons to take this         Discharge home in stable condition Infant Feeding: Breast Infant Disposition:home with mother Discharge instruction: per After Visit Summary and Postpartum booklet. Activity: Advance as tolerated. Pelvic rest for 6 weeks.  Diet: routine diet Future Appointments: Future Appointments  Date Time Provider [place]  04/26/2020  9:15 AM [place], [place] [facility]   Follow up Visit:   Please schedule this patient for a In person postpartum visit in 4 weeks with the following provider: Any provider. Additional Postpartum F/U:n/a  Low risk pregnancy complicated by: n/a Delivery mode:  Vaginal, Spontaneous  Anticipated Birth Control:  husband vasectomy   Mallie Snooks, MSN, CNM Certified Nurse Midwife, [employer] for [employer], [facility] 04/19/20 6:11 AM

## 2020-04-18 NOTE — Anesthesia Postprocedure Evaluation (Signed)
Anesthesia Post Note  Patient: Joan Guerra  Procedure(s) Performed: AN AD HOC LABOR EPIDURAL     Patient location during evaluation: Mother Baby Anesthesia Type: Epidural Level of consciousness: awake and alert Pain management: pain level controlled Vital Signs Assessment: post-procedure vital signs reviewed and stable Respiratory status: spontaneous breathing, nonlabored ventilation and respiratory function stable Cardiovascular status: stable Postop Assessment: no headache, no backache, epidural receding, no apparent nausea or vomiting, patient able to bend at knees, adequate PO intake and able to ambulate Anesthetic complications: no   No complications documented.  Last Vitals:  Vitals:   04/18/20 0420 04/18/20 0533  BP: 111/70 109/66  Pulse: 82 77  Resp: 18 18  Temp: 36.8 C 36.8 C  SpO2: 100% 98%    Last Pain:  Vitals:   04/18/20 0730  TempSrc:   PainSc: 0-No pain   Pain Goal: Patients Stated Pain Goal: 2 (04/18/20 0533)                 Laban Emperor

## 2020-04-19 MED ORDER — ACETAMINOPHEN 325 MG PO TABS: 650.0000 mg | ORAL_TABLET | ORAL | 3 refills | Status: DC | PRN

## 2020-04-19 MED ORDER — SIMETHICONE 80 MG PO CHEW: 80.0000 mg | CHEWABLE_TABLET | ORAL | 0 refills | Status: DC | PRN

## 2020-04-19 MED ORDER — IBUPROFEN 600 MG PO TABS: 600.0000 mg | ORAL_TABLET | Freq: Four times a day (QID) | ORAL | 0 refills | Status: DC

## 2020-04-19 MED FILL — IBUPROFEN 600 MG TABLET: 600 | 8 days supply | Qty: 30 | Fill #0

## 2020-04-19 NOTE — Discharge Instructions (Signed)

## 2020-04-19 NOTE — Lactation Note (Signed)
This note was copied from a baby's chart. Lactation Consultation Note  Patient Name: Joan Guerra Date: 04/19/2020 Reason for consult: Other (Comment);Follow-up assessment;Early term 37-38.6wks (4 % weight loss, post circ)  Baby is 55 hours old  As LC entered the room , per mom baby recently returned from a circ and ate prior to going @ 9:45 am for 30 mins.  Per mom breast feeding is going well.  Mom did not sore nipples. LC noticed in doc flow sheets the RN had provided mom with coconut oil.  Sore nipple and engorgement prevention and tx reviewed. Per previous LC mom has a hand pump and a DEBP. Storage of breast milk reviewed.  Discussed importance of STS feedings and benefits of the baby feeding better.   Mom has [name].    Maternal Data    Feeding Feeding Type:  (baby ate prior to circ - sleepying at present)  [facility] Score                   Interventions Interventions: Breast feeding basics reviewed  Lactation Tools Discussed/Used Pump Review: Milk Storage   Consult Status Consult Status: Complete Date: 04/19/20    [name] 04/19/2020, 11:37 AM

## 2020-05-17 ENCOUNTER — Ambulatory Visit (INDEPENDENT_AMBULATORY_CARE_PROVIDER_SITE_OTHER): Payer: No Typology Code available for payment source | Admitting: Advanced Practice Midwife

## 2020-05-17 ENCOUNTER — Other Ambulatory Visit: Payer: Self-pay

## 2020-05-17 ENCOUNTER — Encounter: Payer: Self-pay | Admitting: Advanced Practice Midwife

## 2020-05-17 VITALS — BP 107/82 | HR 85 | Wt 157.0 lb

## 2020-05-17 DIAGNOSIS — Z3009 Encounter for other general counseling and advice on contraception: Secondary | ICD-10-CM

## 2020-05-17 DIAGNOSIS — N393 Stress incontinence (female) (male): Secondary | ICD-10-CM

## 2020-05-17 NOTE — Progress Notes (Signed)
Post Partum Exam  Joan Guerra is a 29 y.o. G52P2002 female who presents for a postpartum visit. She is 4 weeks postpartum following a spontaneous vaginal delivery. I have fully reviewed the prenatal and intrapartum course. The delivery was at 38.6 gestational weeks.  Anesthesia: epidural. Postpartum course has been unremarkable. Baby's course has been unremarkable. Baby is feeding by breast. Bleeding staining only. Bowel function is normal. Bladder function is normal. Patient is not sexually active. Contraception method is husband is going to schedule vasectomy soon- pt does not want birth control in the mean time. Postpartum depression screening:neg  The following portions of the patient's history were reviewed and updated as appropriate: allergies, current medications, past family history, past medical history, past social history, past surgical history and problem list.  Last Pap 09/30/19 with normal results  Review of Systems Pertinent items noted in HPI and remainder of comprehensive ROS otherwise negative.    Objective:    BP 116/78 mmHg  Pulse 78  Resp 16  Ht 5\' 5"  (1.651 m)  Wt 211 lb (95.709 kg)  BMI 35.11 kg/m2  Breastfeeding? Yes  VS reviewed, nursing note reviewed,  Constitutional: well developed, well nourished, no distress HEENT: normocephalic CV: normal rate Pulm/chest wall: normal effort Abdomen: soft Neuro: alert and oriented x 3 Skin: warm, dry Psych: affect normal Assessment:   1. Stress incontinence in female --Pt with some incomplete emptying and stress incontinence.  Referral to PT, pt to wait a few weeks to see if improvement after 6-8 weeks PP then will call PT. - Ambulatory referral to Physical Therapy  2. Postpartum examination following vaginal delivery --Doing well, good bonding with baby. Better birth experience this time and no feelings of postpartum depression.  Breastfeeding with some latch issues, discussed options including support from [facility], tongue tie procedures, and continued work with LC on latch.   3. Encounter for counseling regarding contraception --Pt husband plans vasectomy but not yet.  Pt wants to use natural family planning/lactational amenorrhea for now.  Discussed, with lactational amenorrhea more successful if BF exclusively and frequently, with pumping not as effective as suppressing ovulation.  Pt states understanding and will notify the office if she desires additional contraceptive. Has considered IUD for improved periods when menses resume.  Plan:   1. Contraception: lactational amenorrhea 2. Referral to PT for occasional stress incontinence 3. Follow up in: 1 year or as needed.

## 2020-05-17 NOTE — Patient Instructions (Signed)
[email or web address]  Patient education: Pelvic floor muscle exercises (Beyond the Basics)  Author:Linda Desma MaximBrubaker, MD, FACOGSection Editor:Robert Wyline MoodL Barbieri, MDDeputy Leighton RoachEditor:Kristen Eckler, MD, FACOG   INTRODUCTION The "pelvic floor" refers to a group of muscles that support the organs in the pelvis. These organs include the bladder and rectum; in the female pelvis, they also include the uterus (figure 1).  The pelvic floor muscles play an important role in bladder and bowel control. Like any muscles, they can become injured or weakened. Contributing factors can include pregnancy, vaginal childbirth, obesity, and certain types of surgery; however, these muscles can also become weaker over time due to normal aging.  WHAT DO PELVIC FLOOR MUSCLE EXERCISES DO? The goal of these exercises (which are sometimes called "Kegel" exercises) is to strengthen the pelvic floor muscles. When these muscles become weak, it increases the risk of problems such as:  ?Urinary incontinence - This is when a person leaks urine or loses bladder control. One type, "stress incontinence," occurs when the muscles and tissues around the urethra (figure 2) do not stay closed properly when there is increased pressure in the abdomen (for example, when the person coughs, sneezes, or does something strenuous). Stress incontinence is common in people who have given birth. It can also happen after surgery to treat prostate cancer or an enlarged prostate.  Another type is called "urge incontinence"; this is when a person regularly feels a sudden urgent need to urinate.  ?Fecal incontinence - This refers to the involuntary loss of liquid or solid stool. "Anal incontinence" can also mean the involuntary passing of gas. Injury to the pelvic floor muscles (for example, during vaginal childbirth) can contribute to incontinence.  ?Pelvic organ prolapse - This is  when the bladder, rectum, or uterus drop down and bulge into the vagina. This can happen if the pelvic floor is weakened and unable to support the organs (figure 3). While some people with pelvic organ prolapse have no symptoms, others notice a feeling of fullness or a bulge in the vagina.  If you have any of these problems, doing exercises to strengthen your pelvic floor may help improve symptoms (see 'Treating existing problems' below). These exercises have not consistently been shown to prevent new problems from developing. (See 'Reducing the risk of new problems' below.)  If you are interested in trying pelvic floor muscle exercises, it is a good idea to talk with your health care provider first. There are some situations in which these exercises are not recommended: for example, in the case of certain types of injury that can result from childbirth (which need to heal before exercise can be beneficial). These exercises may also worsen symptoms in people with a condition called myofascial pelvic pain syndrome (in which abnormalities of the pelvic muscles and surrounding tissues can cause pain with sex or bladder problems). People with this condition are typically treated by a physical therapist with specialized training.  Your provider can help you understand whether pelvic floor muscle exercises are likely to be helpful for your situation, teach you how to do the exercises correctly, and refer you to a physical therapist if needed. (See 'The role of the physical therapist' below.)  LEARNING THE PROPER TECHNIQUE As mentioned above, your health care provider can help you identify which muscles to contract for these exercises. They might do this by inserting a finger into your vagina and asking you to squeeze your pelvic muscles.  You will want to tighten the muscles as though you are trying to  stop the flow of urine or hold back gas. However, it is not recommended that you practice this by actually  stopping the flow of urine while you are on the toilet. There is concern that this could lead to a urinary tract infection. It may help to imagine that you are sitting on a marble (do not use a real marble) and using your pelvic muscles to lift it off the chair. Think about squeezing the muscles closest to your vagina and anus.  While it can be challenging to learn how to contract your pelvic floor muscles without using your abdominal, buttock, or thigh muscles, it is important to learn to do so in order for the exercises to be effective. This will get easier with time and practice.  Once you have figured out how to isolate the right muscles, you can begin to strengthen them. To do this:  ?Contract - Squeeze your pelvic floor muscles.  ?Hold - Keep contracting the muscles for 8 to 10 seconds. In the beginning, you may not be able to hold the contraction for this long, but over time, you will build up strength.  ?Relax - Relax your pelvic floor fully. This step is as important as contracting the muscles.  Over time, try to hold the muscle contraction harder and for a longer time before relaxing. As with other forms of exercise, you will become stronger with practice, and you will need to keep up your routine in order to notice long-term effects. It can help to work with a pelvic floor physical therapist; these are trained professionals who can teach you how to do these exercises effectively. (See 'The role of the physical therapist' below.)  You can do these exercises in any position (standing, sitting, or lying down) and work them into your daily routine in a way that is convenient for you.  How often should I do these exercises? -- A typical regimen involves doing this exercise (to contract your pelvic floor muscles, hold, then relax) 8 to 12 times per session, for three sessions every day, if possible. This routine should continue for at least 15 to 20 weeks. Your health care provider can talk to  you about your specific situation and whether you should follow a different regimen. It takes time to strengthen your pelvic floor muscles, especially if they have been weakened or injured, so try to be patient and keep working on it.  The role of the physical therapist -- Some people benefit from working with a physical therapist or specially trained nurse. This can help to ensure that you are using the correct technique in order to get the most out of your exercises.  In addition, these providers may use other methods to help you improve your technique and maximize results, such as:  ?Biofeedback - This typically involves inserting a sensor into your vagina that can identify which muscles you are contracting and measure the strength of each contraction. This can help if you are having trouble isolating your pelvic floor muscles and can also give you an idea of your progress as you strengthen these muscles over time.  ?Electrical stimulation - This can be done along with biofeedback. It involves placing a device into the vagina or anus; the device delivers a small electrical current that causes the pelvic floor muscles to contract.  ?Vaginal weights - You can purchase weighted cones that you hold in your vagina to help increase strength. You use your pelvic floor muscles to keep the weight  in place during your normal daily activities. While there is limited evidence supporting this approach, some people find that it helps them strengthen their pelvic floor. Vaginal weights are easy to use, relatively inexpensive, and can be purchased online.  BENEFITS OF PELVIC FLOOR MUSCLE EXERCISES  Treating existing problems -- In addition to strengthening your pelvic floor muscles in general, exercises can sometimes help in the following situations:  ?Preventing leakage of urine in stress incontinence - Stress incontinence (ie, leaking urine when doing certain things that stress the pelvic muscles) is common.  Once you know how to contract your pelvic floor muscles effectively, you can get into the habit of doing this any time you are about to laugh, cough, sneeze, lift something heavy, or do anything else that might cause leakage.  ?Controlling sudden urges to urinate - People with urge incontinence feel a strong need to urinate all of a sudden. If you have this urge, rather than running to the bathroom, sit or stand still and contract your pelvic muscles. Once the urge decreases, you can then go to the toilet.  ?Improving fecal and anal incontinence (the involuntary leakage of stool or gas).  ?Relieving symptoms of pelvic organ prolapse, such as a feeling of fullness or pressure in the vagina.  If you have any of these problems and pelvic floor muscle exercises do not seem to be helping after several months, talk to your health care provider. They may recommend changing the way you do the exercises or trying other approaches. While pelvic floor muscle exercises can be beneficial, many people with incontinence or pelvic organ prolapse need other treatments as well. (See "Patient education: Urinary incontinence in women (Beyond the Basics)" and "Patient education: Urinary incontinence treatments for women (Beyond the Basics)" and "Patient education: Fecal incontinence (Beyond the Basics)".)  Reducing the risk of new problems -- People often wonder whether they can reduce their risk of developing incontinence or pelvic organ prolapse after pregnancy and childbirth. Evidence is mixed as to whether doing pelvic muscle exercises during pregnancy can help with this. While there is no way to definitively prevent the pelvic floor from becoming weakened (due to pregnancy, childbirth, obesity, or just normal aging), exercises may help since they strengthen the muscles that support the pelvic organs. In addition, pelvic floor exercises are unlikely to be harmful in most situations.  WHERE TO GET MORE INFORMATION Your  health care provider is the best source of information for questions and concerns related to your medical problem.  This article will be updated as needed on our web site ([email or web address]). Related topics for patients, as well as selected articles written for health care professionals, are also available. Some of the most relevant are listed below.  Patient level information -- UpToDate offers two types of patient education materials.  The Basics -- The Basics patient education pieces answer the four or five key questions a patient might have about a given condition. These articles are best for patients who want a general overview and who prefer short, easy-to-read materials.  Patient education: Pelvic muscle (Kegel) exercises (The Basics) Patient education: Urinary incontinence (The Basics) Patient education: Treatments for urgency incontinence in women (The Basics) Patient education: Fecal incontinence (The Basics) Patient education: Pelvic organ prolapse (The Basics)  Beyond the Basics -- Beyond the Basics patient education pieces are longer, more sophisticated, and more detailed. These articles are best for patients who want in-depth information and are comfortable with some medical jargon.  Patient education: Urinary incontinence in  women (Beyond the Basics) Patient education: Urinary incontinence treatments for women (Beyond the Basics)  Professional level information -- Professional level articles are designed to keep doctors and other health professionals up-to-date on the latest medical findings. These articles are thorough, long, and complex, and they contain multiple references to the research on which they are based. Professional level articles are best for people who are comfortable with a lot of medical terminology and who want to read the same materials their doctors are reading.  Effect of pregnancy and childbirth on urinary incontinence and pelvic organ  prolapse Treatment of urinary incontinence in females Pelvic organ prolapse in women: Epidemiology, risk factors, clinical manifestations, and management Sexual function in women with pelvic floor and lower urinary tract disorders  The following organizations also provide reliable health information.  ?[employer]       (LostMillions.com.pt.html)  ?American Academy of Family Physicians       (www.familydoctor.org)  ?Pelvic Floor Disorders Research Foundation       (www.voicesforpfd.org)  ?National Association for [employer]ence       1-800-BLADDER      (http://www.herrera.com/) Use of UpToDate is subject to the Subscription and License Agreement. Topic 8403 Version 26.0

## 2020-05-25 ENCOUNTER — Other Ambulatory Visit: Payer: Self-pay | Admitting: Advanced Practice Midwife

## 2020-05-25 DIAGNOSIS — B001 Herpesviral vesicular dermatitis: Secondary | ICD-10-CM

## 2020-05-25 DIAGNOSIS — Z8619 Personal history of other infectious and parasitic diseases: Secondary | ICD-10-CM

## 2020-05-25 MED ORDER — VALACYCLOVIR HCL 1 G PO TABS: ORAL_TABLET | ORAL | 5 refills | Status: DC

## 2020-05-25 MED ORDER — VALACYCLOVIR HCL 500 MG PO TABS: 500.0000 mg | ORAL_TABLET | Freq: Every day | ORAL | 11 refills | Status: DC

## 2020-05-25 MED FILL — VALACYCLOVIR HCL 500 MG TAB: 500 | 30 days supply | Qty: 30 | Fill #0

## 2020-05-26 ENCOUNTER — Encounter: Payer: Self-pay | Admitting: *Deleted

## 2020-07-31 ENCOUNTER — Telehealth: Payer: No Typology Code available for payment source | Admitting: Family

## 2020-07-31 DIAGNOSIS — N61 Mastitis without abscess: Secondary | ICD-10-CM | POA: Diagnosis not present

## 2020-07-31 MED ORDER — CEPHALEXIN 500 MG PO CAPS
500.0000 mg | ORAL_CAPSULE | Freq: Four times a day (QID) | ORAL | 0 refills | Status: AC
Start: 2020-07-31 — End: 2020-08-07

## 2020-07-31 NOTE — Progress Notes (Signed)
E Visit for Cellulitis  We are sorry that you are not feeling well. Here is how we plan to help!  Based on what you shared with me it looks like you have cellulitis.  Cellulitis looks like areas of skin redness, swelling, and warmth; it develops as a result of bacteria entering under the skin. Little red spots and/or bleeding can be seen in skin, and tiny surface sacs containing fluid can occur. Fever can be present. Cellulitis is almost always on one side of a body, and the lower limbs are the most common site of involvement.   I have prescribed:  Keflex 500mg  take one by mouth four times a day for 7  Days. Please call you gyn or PCP and make a follow up appt asap.   HOME CARE:  . Take your medications as ordered and take all of them, even if the skin irritation appears to be healing.   GET HELP RIGHT AWAY IF:  . Symptoms that don't begin to go away within 48 hours. . Severe redness persists or worsens . If the area turns color, spreads or swells. . If it blisters and opens, develops yellow-brown crust or bleeds. . You develop a fever or chills. . If the pain increases or becomes unbearable.  . Are unable to keep fluids and food down.  MAKE SURE YOU    Understand these instructions.  Will watch your condition.  Will get help right away if you are not doing well or get worse.  Thank you for choosing an e-visit. Your e-visit answers were reviewed by a board certified advanced clinical practitioner to complete your personal care plan. Depending upon the condition, your plan could have included both over the counter or prescription medications. Please review your pharmacy choice. Make sure the pharmacy is open so you can pick up prescription now. If there is a problem, you may contact your provider through and have the prescription routed to another pharmacy. Your safety is important to [employer]. If you have drug allergies check your prescription carefully.  For the next  24 hours you can use MyChart to ask questions about today's visit, request a non-urgent call back, or ask for a work or school excuse. You will get an email in the next two days asking about your experience. I hope that your e-visit has been valuable and will speed your recovery.   Approximately 5 minutes was spent documenting and reviewing patient's chart.

## 2020-09-15 ENCOUNTER — Telehealth: Payer: No Typology Code available for payment source | Admitting: Orthopedic Surgery

## 2020-09-15 DIAGNOSIS — N760 Acute vaginitis: Secondary | ICD-10-CM

## 2020-09-15 MED ORDER — FLUCONAZOLE 150 MG PO TABS
150.0000 mg | ORAL_TABLET | Freq: Once | ORAL | 0 refills | Status: AC
Start: 2020-09-15 — End: 2020-09-15

## 2020-09-15 NOTE — Progress Notes (Signed)
We are sorry that you are not feeling well. Here is how we plan to help! Based on what you shared with me it looks like you: May have a yeast vaginosis  Vaginosis is an inflammation of the vagina that can result in discharge, itching and pain. The cause is usually a change in the normal balance of vaginal bacteria or an infection. Vaginosis can also result from reduced estrogen levels after menopause.  The most common causes of vaginosis are:   Bacterial vaginosis which results from an overgrowth of one on several organisms that are normally present in your vagina.   Yeast infections which are caused by a naturally occurring fungus called candida.   Vaginal atrophy (atrophic vaginosis) which results from the thinning of the vagina from reduced estrogen levels after menopause.   Trichomoniasis which is caused by a parasite and is commonly transmitted by sexual intercourse.  Factors that increase your risk of developing vaginosis include: Marland Kitchen Medications, such as antibiotics and steroids . Uncontrolled diabetes . Use of hygiene products such as bubble bath, vaginal spray or vaginal deodorant . Douching . Wearing damp or tight-fitting clothing . Using an intrauterine device (IUD) for birth control . Hormonal changes, such as those associated with pregnancy, birth control pills or menopause . Sexual activity . Having a sexually transmitted infection  Your treatment plan is A single Diflucan (fluconazole) 150mg  tablet once.  I have electronically sent this prescription into the pharmacy that you have chosen. If this does not improve your symptoms in a few days you have have a vaginosis due to bacteria and will need a different treatment. Please contact if that is the case.  Be sure to take all of the medication as directed. Stop taking any medication if you develop a rash, tongue swelling or shortness of breath. Mothers who are breast feeding should consider pumping and discarding their  breast milk while on these antibiotics. However, there is no consensus that infant exposure at these doses would be harmful.  Remember that medication creams can weaken latex condoms. [place]   HOME CARE:  Good hygiene may prevent some types of vaginosis from recurring and may relieve some symptoms:  . Avoid baths, hot tubs and whirlpool spas. Rinse soap from your outer genital area after a shower, and dry the area well to prevent irritation. Don't use scented or harsh soaps, such as those with deodorant or antibacterial action. Marland Kitchen Avoid irritants. These include scented tampons and pads. . Wipe from front to back after using the toilet. Doing so avoids spreading fecal bacteria to your vagina.  Other things that may help prevent vaginosis include:  Marland Kitchen Don't douche. Your vagina doesn't require cleansing other than normal bathing. Repetitive douching disrupts the normal organisms that reside in the vagina and can actually increase your risk of vaginal infection. Douching won't clear up a vaginal infection. . Use a latex condom. Both female and female latex condoms may help you avoid infections spread by sexual contact. . Wear cotton underwear. Also wear pantyhose with a cotton crotch. If you feel comfortable without it, skip wearing underwear to bed. Yeast thrives in Marland Kitchen Your symptoms should improve in the next day or two.  GET HELP RIGHT AWAY IF:  . You have pain in your lower abdomen ( pelvic area or over your ovaries) . You develop nausea or vomiting . You develop a fever . Your discharge changes or worsens . You have persistent pain with intercourse . You develop shortness of  breath, a rapid pulse, or you faint.  These symptoms could be signs of problems or infections that need to be evaluated by a medical provider now.  MAKE SURE YOU    Understand these instructions.  Will watch your condition.  Will get help right away if you are not doing well or get worse.  Your  e-visit answers were reviewed by a board certified advanced clinical practitioner to complete your personal care plan. Depending upon the condition, your plan could have included both over the counter or prescription medications. Please review your pharmacy choice to make sure that you have choses a pharmacy that is open for you to pick up any needed prescription, Your safety is important to [place]. If you have drug allergies check your prescription carefully.   You can use MyChart to ask questions about today's visit, request a non-urgent call back, or ask for a work or school excuse for 24 hours related to this e-Visit. If it has been greater than 24 hours you will need to follow up with your provider, or enter a new e-Visit to address those concerns. You will get a MyChart message within the next two days asking about your experience. I hope that your e-visit has been valuable and will speed your recovery.  Greater than 5 minutes, yet less than 10 minutes of time have been spent researching, coordinating and implementing care for this patient today.

## 2020-09-30 ENCOUNTER — Encounter: Payer: Self-pay | Admitting: Certified Nurse Midwife

## 2020-09-30 ENCOUNTER — Telehealth (INDEPENDENT_AMBULATORY_CARE_PROVIDER_SITE_OTHER): Payer: No Typology Code available for payment source | Admitting: Certified Nurse Midwife

## 2020-09-30 DIAGNOSIS — O99345 Other mental disorders complicating the puerperium: Secondary | ICD-10-CM | POA: Diagnosis not present

## 2020-09-30 DIAGNOSIS — F53 Postpartum depression: Secondary | ICD-10-CM | POA: Diagnosis not present

## 2020-09-30 MED ORDER — ESCITALOPRAM OXALATE 10 MG PO TABS: 10.0000 mg | ORAL_TABLET | Freq: Every day | ORAL | 1 refills | Status: DC

## 2020-09-30 NOTE — Progress Notes (Signed)
    GYNECOLOGY VIRTUAL VISIT ENCOUNTER NOTE  Provider location: [facility] [place]   I connected with [name] on 09/30/20 at  9:20 AM EST by MyChart Video Encounter at home and verified that I am speaking with the correct person using two identifiers.   I discussed the limitations, risks, security and privacy concerns of performing an evaluation and management service virtually and the availability of in person appointments. I also discussed with the patient that there may be a patient responsible charge related to this service. The patient expressed understanding and agreed to proceed.   History:  Joan Guerra is a 30 y.o. [phone] female being evaluated today for PPD. She is 5 months postpartum. Reports feelings of anger, resentment toward herself, and sadness. Has occasional thoughts of "it would be easier if I didn't wake up" but no thoughts of self harm or plan. No HI. Feels overwhelmed presently with work deadlines and her family is sick with Covid and quarantined. She stopped breastfeeding last month in hopes that would help but did not. Has been able to complete ADLs. Feels "like a cloud is over me". Has history of PPD after her first delivery in 2018. She took Zoloft which helped but was unable to have any high or low feelings. She would like to try a different medication if possible. She started seeing her therapist Dorann Lodge again yesterday and plans to weekly. Scored 19 on EDS.    Past Medical History:  Diagnosis Date  . Allergy   . Anxiety   . Complication of anesthesia    BPs dropped very low with last epidural  . HSV (herpes simplex virus) anogenital infection    cold sores not genital   Past Surgical History:  Procedure Laterality Date  . CHOLECYSTECTOMY     The following portions of the patient's history were reviewed and updated as appropriate: allergies, current medications, past family history, past medical history, past social history, past  surgical history and problem list.   Health Maintenance:  Normal pap and negative HRHPV on 09/30/19.  Normal mammogram on n/a.   Review of Systems:  Pertinent items noted in HPI and remainder of comprehensive ROS otherwise negative.  Physical Exam:   General:  Alert, oriented and cooperative. Patient appears to be in no acute distress.  Mental Status: Normal mood and affect. Normal behavior. Normal judgment and thought content.   Respiratory: Normal respiratory effort, no problems with respiration noted  Rest of physical exam deferred due to type of encounter  Labs and Imaging No results found for this or any previous visit (from the past 336 hour(s)). No results found.     Assessment and Plan:   1. Postpartum depression associated with second pregnancy    CBC CMP TSH Recommend weekly psychotherapy  Follow up with GYN in 2-3 weeks for mood check Rx Lexapro 10mg  Discussed sx that require emergent evaluation   I discussed the assessment and treatment plan with the patient. The patient was provided an opportunity to ask questions and all were answered. The patient agreed with the plan and demonstrated an understanding of the instructions.   The patient was advised to call back or seek an in-person evaluation/go to the ED if the symptoms worsen or if the condition fails to improve as anticipated.  I provided 23 minutes of face-to-face time during this encounter.   , CNM [facility], [facility]

## 2020-09-30 NOTE — Progress Notes (Signed)
PHQ-9: Score 19 Pt states the thought of harming herself has occurred to her but she states, "I do not think I am a danger to myself"

## 2020-10-10 ENCOUNTER — Other Ambulatory Visit: Payer: Self-pay | Admitting: *Deleted

## 2020-10-10 DIAGNOSIS — F53 Postpartum depression: Secondary | ICD-10-CM

## 2020-10-12 LAB — COMPREHENSIVE METABOLIC PANEL
AG Ratio: 1.8 (calc) (ref 1.0–2.5)
ALT: 14 U/L (ref 6–29)
AST: 11 U/L (ref 10–30)
Albumin: 4.7 g/dL (ref 3.6–5.1)
Alkaline phosphatase (APISO): 77 U/L (ref 31–125)
BUN: 11 mg/dL (ref 7–25)
CO2: 29 mmol/L (ref 20–32)
Calcium: 9.8 mg/dL (ref 8.6–10.2)
Chloride: 102 mmol/L (ref 98–110)
Creat: 0.66 mg/dL (ref 0.50–1.10)
Globulin: 2.6 g/dL (calc) (ref 1.9–3.7)
Glucose, Bld: 85 mg/dL (ref 65–99)
Potassium: 4.4 mmol/L (ref 3.5–5.3)
Sodium: 138 mmol/L (ref 135–146)
Total Bilirubin: 0.6 mg/dL (ref 0.2–1.2)
Total Protein: 7.3 g/dL (ref 6.1–8.1)

## 2020-10-12 LAB — TSH: TSH: 2.16 mIU/L

## 2020-10-12 LAB — CBC
HCT: 43.6 % (ref 35.0–45.0)
Hemoglobin: 15.2 g/dL (ref 11.7–15.5)
MCH: 31.3 pg (ref 27.0–33.0)
MCHC: 34.9 g/dL (ref 32.0–36.0)
MCV: 89.7 fL (ref 80.0–100.0)
MPV: 9.7 fL (ref 7.5–12.5)
Platelets: 371 10*3/uL (ref 140–400)
RBC: 4.86 10*6/uL (ref 3.80–5.10)
RDW: 11.8 % (ref 11.0–15.0)
WBC: 7.4 10*3/uL (ref 3.8–10.8)

## 2020-10-18 ENCOUNTER — Other Ambulatory Visit (HOSPITAL_BASED_OUTPATIENT_CLINIC_OR_DEPARTMENT_OTHER): Payer: Self-pay | Admitting: Family Medicine

## 2020-10-18 DIAGNOSIS — E041 Nontoxic single thyroid nodule: Secondary | ICD-10-CM

## 2020-10-21 ENCOUNTER — Ambulatory Visit (HOSPITAL_BASED_OUTPATIENT_CLINIC_OR_DEPARTMENT_OTHER)
Admission: RE | Admit: 2020-10-21 | Discharge: 2020-10-21 | Disposition: A | Discharge status: Home / Self Care | Payer: No Typology Code available for payment source | Source: Ambulatory Visit | Attending: Family Medicine | Admitting: Family Medicine

## 2020-10-21 ENCOUNTER — Other Ambulatory Visit: Payer: Self-pay

## 2020-10-21 DIAGNOSIS — E041 Nontoxic single thyroid nodule: Secondary | ICD-10-CM | POA: Insufficient documentation

## 2020-10-25 ENCOUNTER — Other Ambulatory Visit: Payer: Self-pay | Admitting: Family Medicine

## 2020-10-25 DIAGNOSIS — E041 Nontoxic single thyroid nodule: Secondary | ICD-10-CM

## 2020-10-27 ENCOUNTER — Ambulatory Visit
Admission: RE | Admit: 2020-10-27 | Discharge: 2020-10-27 | Disposition: A | Discharge status: Home / Self Care | Payer: No Typology Code available for payment source | Source: Ambulatory Visit | Attending: Family Medicine | Admitting: Family Medicine

## 2020-10-27 ENCOUNTER — Other Ambulatory Visit (HOSPITAL_COMMUNITY)
Admission: RE | Admit: 2020-10-27 | Discharge: 2020-10-27 | Disposition: A | Discharge status: Home / Self Care | Payer: No Typology Code available for payment source | Source: Ambulatory Visit | Attending: Family Medicine | Admitting: Family Medicine

## 2020-10-27 DIAGNOSIS — E041 Nontoxic single thyroid nodule: Secondary | ICD-10-CM | POA: Insufficient documentation

## 2020-10-31 LAB — CYTOLOGY - NON PAP

## 2020-11-21 ENCOUNTER — Telehealth: Payer: Self-pay | Admitting: *Deleted

## 2020-11-21 NOTE — Telephone Encounter (Signed)
Patient was given patient accounting number to contact them due to a bill that she received.

## 2021-02-06 IMAGING — US US MFM OB COMP +14 WKS
1 series · 13 of 28 positions shown · non-contrast
Comparison: none

[Series 1: us mfm ob comp +14 wks · 80 acquisitions, 13 frames shown]
[im 3/80]
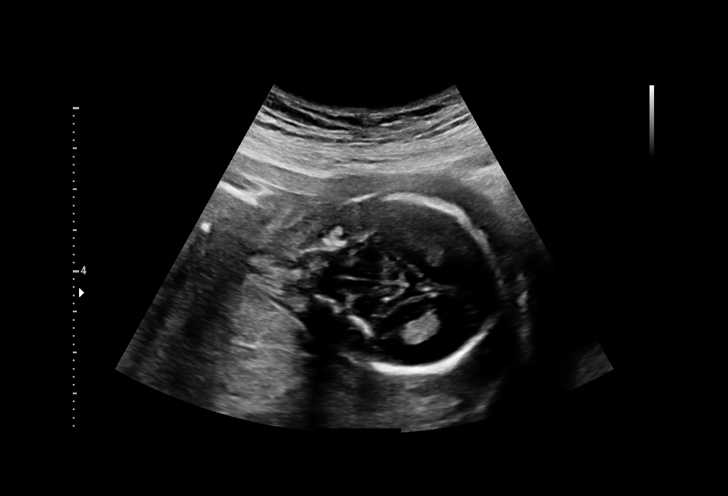
[im 9/80]
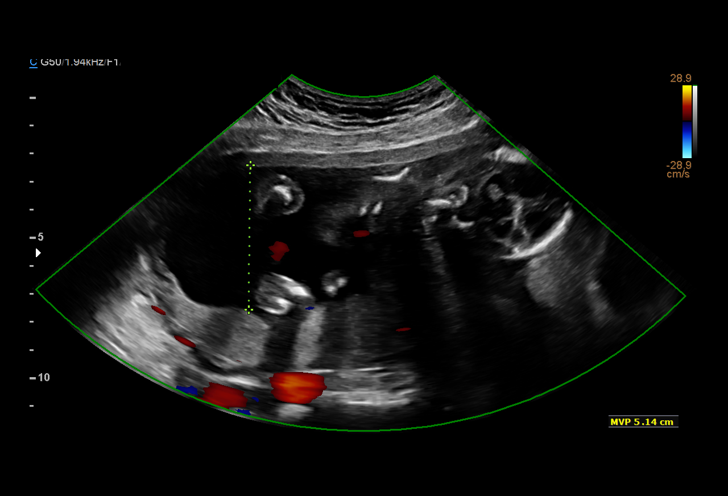
[im 15/80]
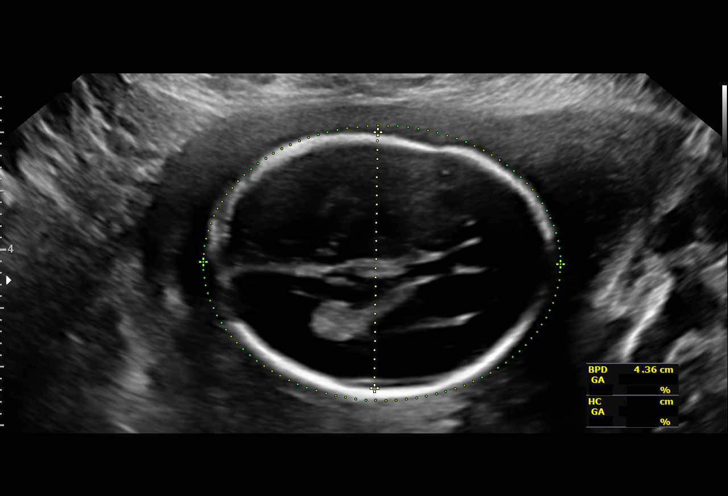
[im 21/80]
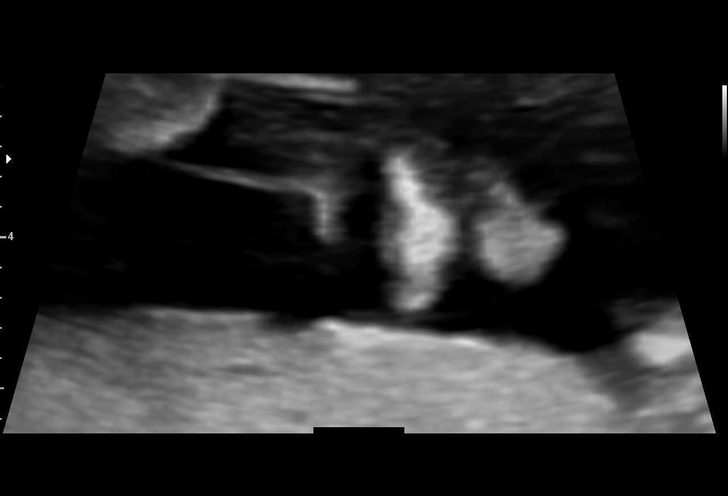
[im 27/80]
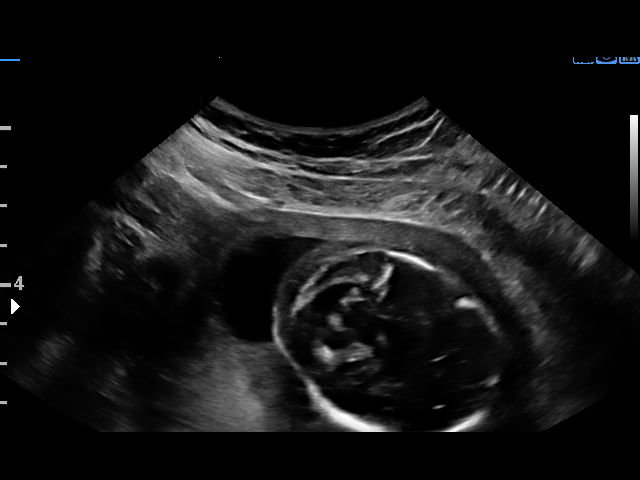
[im 33/80]
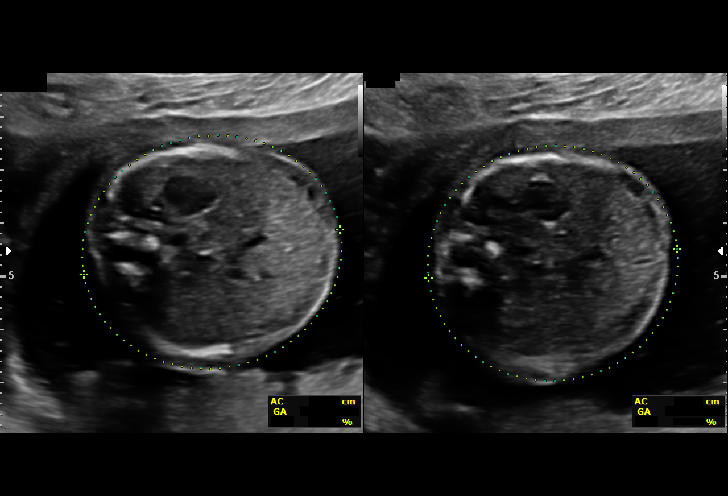
[im 41/80]
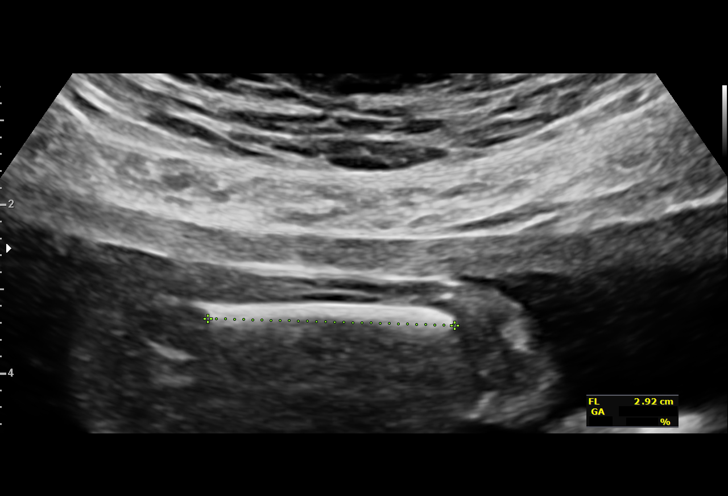
[im 47/80]
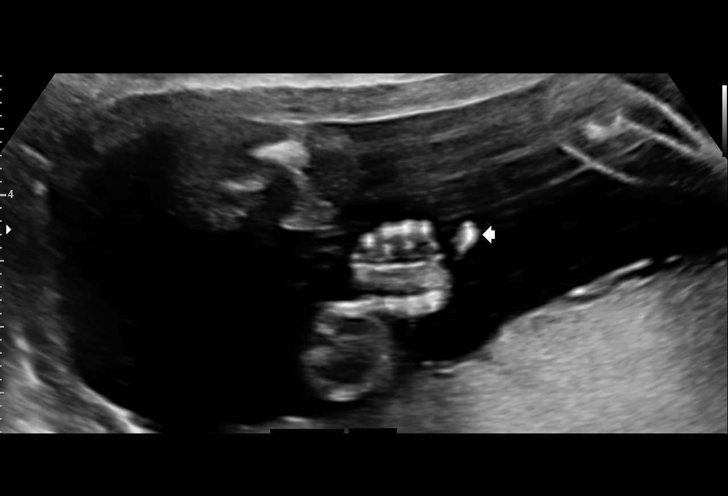
[im 53/80]
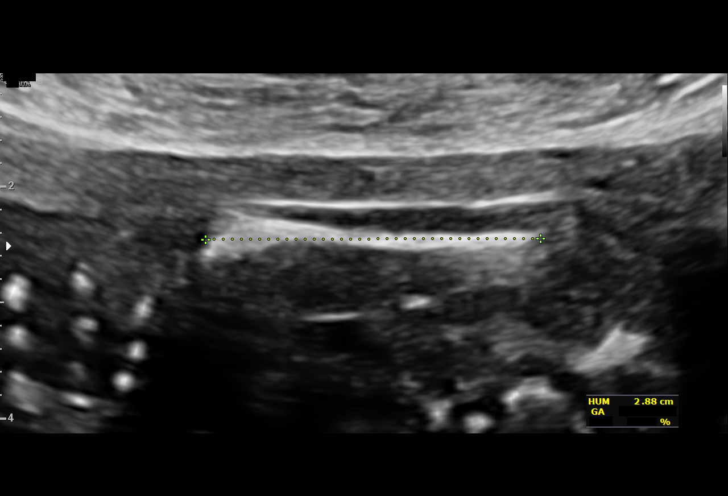
[im 59/80]
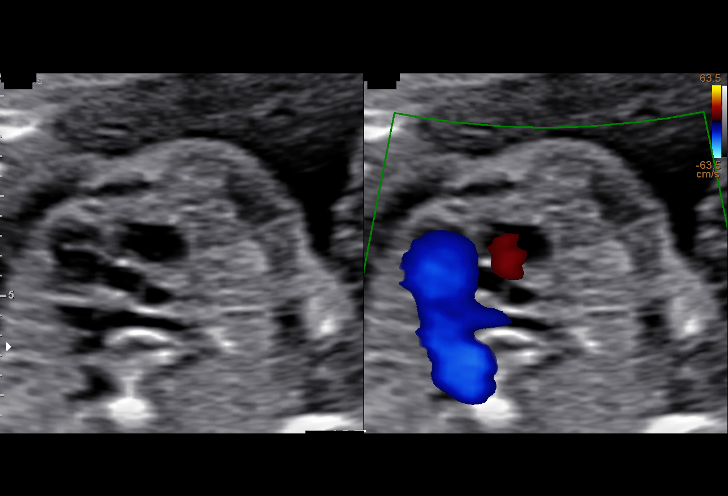
[im 65/80]
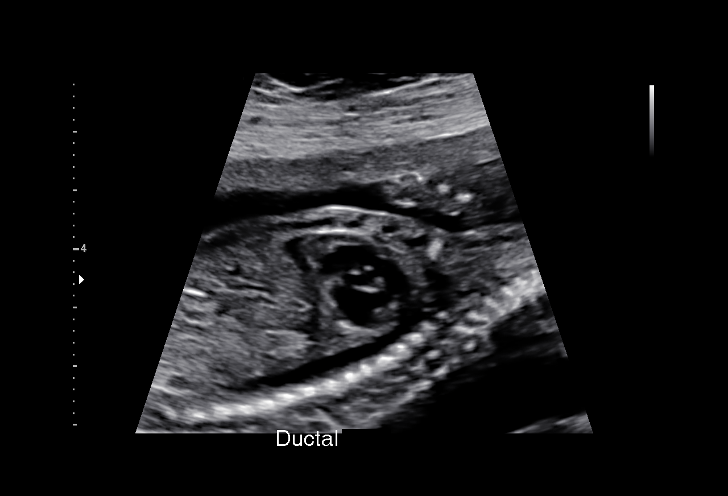
[im 71/80]
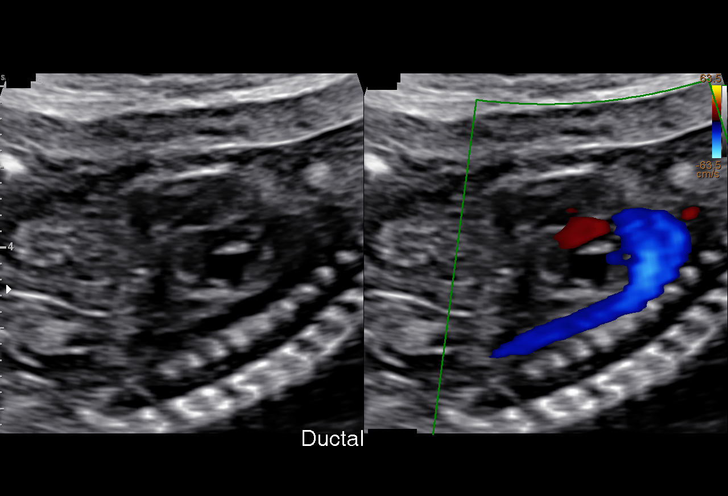
[im 77/80]
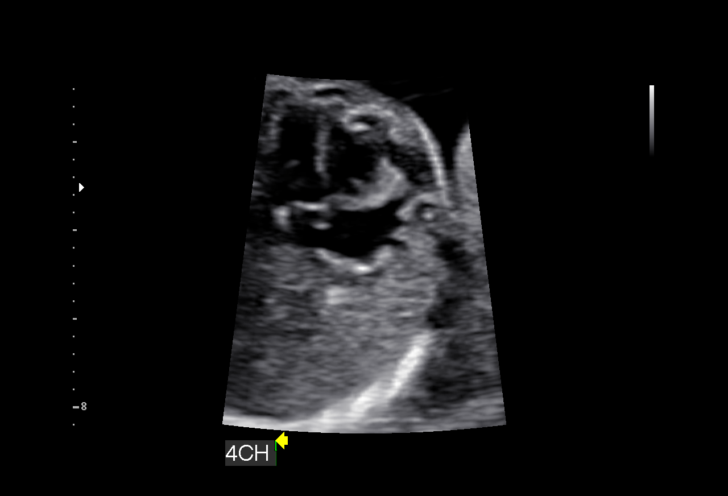

[13 of 28 positions shown; findings below may reference images not displayed]

----------------------------------------------------------------------

 ----------------------------------------------------------------------
Indications

  Encounter for antenatal screening for
  malformations
  19 weeks gestation of pregnancy
 ----------------------------------------------------------------------
Fetal Evaluation

 Num Of Fetuses:         1
 Fetal Heart Rate(bpm):  151
 Cardiac Activity:       Observed
 Presentation:           Cephalic
 Placenta:               Posterior
 P. Cord Insertion:      Visualized

 Amniotic Fluid
 AFI FV:      Within normal limits

                             Largest Pocket(cm)

Biometry

 BPD:      43.3  mm     G. Age:  19w 1d         55  %    CI:         66.7   %    70 - 86
                                                         FL/HC:      17.4   %    16.1 -
 HC:      169.9  mm     G. Age:  19w 4d         72  %    HC/AC:      1.18        1.09 -
 AC:      143.5  mm     G. Age:  19w 5d         69  %    FL/BPD:     68.1   %
 FL:       29.5  mm     G. Age:  19w 1d         47  %    FL/AC:      20.6   %    20 - 24
 HUM:      28.1  mm     G. Age:  19w 0d         51  %
 CER:      20.3  mm     G. Age:  19w 2d         57  %
 NFT:       4.1  mm

 LV:        4.6  mm
 CM:        2.9  mm

 Est. FW:     291  gm    0 lb 10 oz      70  %
OB History

 Gravidity:    2         Term:   1
 Living:       1
Gestational Age

 LMP:           19w 0d        Date:  07/21/19                 EDD:   04/26/20
 U/S Today:     19w 3d                                        EDD:   04/23/20
 Best:          19w 0d     Det. By:  LMP  (07/21/19)          EDD:   04/26/20
Anatomy

 Cranium:               Appears normal         Aortic Arch:            Appears normal
 Cavum:                 Appears normal         Ductal Arch:            Appears normal
 Ventricles:            Appears normal         Diaphragm:              Appears normal
 Choroid Plexus:        Appears normal         Stomach:                Appears normal, left
                                                                       sided
 Cerebellum:            Appears normal         Abdomen:                Appears normal
 Posterior Fossa:       Appears normal         Abdominal Wall:         Appears nml (cord
                                                                       insert, abd wall)
 Nuchal Fold:           Appears normal         Cord Vessels:           Appears normal (3
                                                                       vessel cord)
 Face:                  Appears normal         Kidneys:                Appear normal
                        (orbits and profile)
 Lips:                  Appears normal         Bladder:                Appears normal
 Thoracic:              Appears normal         Spine:                  Limited views
                                                                       appear normal
 Heart:                 Appears normal         Upper Extremities:      Appears normal
                        (4CH, axis, and
                        situs)
 RVOT:                  Appears normal         Lower Extremities:      Appears normal
 LVOT:                  Appears normal

 Other:  Male gender. Technically difficult due to fetal position. Nasal bone
         visualized.
Cervix Uterus Adnexa

 Cervix
 Length:           3.81  cm.
 Normal appearance by transabdominal scan.
Impression

 We performed fetal anatomy scan. No makers of
 aneuploidies or fetal structural defects are seen. Fetal
 biometry is consistent with her previously-established dates.
 Amniotic fluid is normal and good fetal activity is seen.
 Patient understands the limitations of ultrasound in detecting
 fetal anomalies.
 Patient had opted not to screen for fetal aneuploidies.
Recommendations

 Follow-up scans as clinically indicated.
                 Watanabe, Chelly

## 2021-02-09 ENCOUNTER — Telehealth: Payer: No Typology Code available for payment source | Admitting: Emergency Medicine

## 2021-02-09 DIAGNOSIS — W57XXXA Bitten or stung by nonvenomous insect and other nonvenomous arthropods, initial encounter: Secondary | ICD-10-CM | POA: Diagnosis not present

## 2021-02-09 DIAGNOSIS — S80861A Insect bite (nonvenomous), right lower leg, initial encounter: Secondary | ICD-10-CM

## 2021-02-09 DIAGNOSIS — L089 Local infection of the skin and subcutaneous tissue, unspecified: Secondary | ICD-10-CM | POA: Diagnosis not present

## 2021-02-09 MED ORDER — DOXYCYCLINE HYCLATE 100 MG PO CAPS: 100.0000 mg | ORAL_CAPSULE | Freq: Two times a day (BID) | ORAL | 0 refills | Status: AC

## 2021-02-09 NOTE — Progress Notes (Signed)
E Visit for Cellulitis  Joan Guerra,  Thank you for the additional information.  I agree it is concerning for a potential tick bite.  Because of this, I am sending in doxycycline, which will treat both tick illness as well as cellulitis not related to ticks.  You have noted you are not pregnant and not breastfeeding.  This medication is not advised to take while pregnant or breastfeeding.  If no longer breastfeeding, please notify your OB/GYN or PCP to remove lactation from your current profile.   We are sorry that you are not feeling well. Here is how we plan to help!  Based on what you shared with me it looks like you have cellulitis.  Cellulitis looks like areas of skin redness, swelling, and warmth; it develops as a result of bacteria entering under the skin. Little red spots and/or bleeding can be seen in skin, and tiny surface sacs containing fluid can occur. Fever can be present. Cellulitis is almost always on one side of a body, and the lower limbs are the most common site of involvement.   I have prescribed:  Doxycycline   HOME CARE:  . Take your medications as ordered and take all of them, even if the skin irritation appears to be healing.   GET HELP RIGHT AWAY IF:  . Symptoms that don't begin to go away within 48 hours. . Severe redness persists or worsens . If the area turns color, spreads or swells. . If it blisters and opens, develops yellow-brown crust or bleeds. . You develop a fever or chills. . If the pain increases or becomes unbearable.  . Are unable to keep fluids and food down.  MAKE SURE YOU    Understand these instructions.  Will watch your condition.  Will get help right away if you are not doing well or get worse.  Thank you for choosing an e-visit. Your e-visit answers were reviewed by a board certified advanced clinical practitioner to complete your personal care plan. Depending upon the condition, your plan could have included both over the counter or  prescription medications. Please review your pharmacy choice. Make sure the pharmacy is open so you can pick up prescription now. If there is a problem, you may contact your provider through [employer] and have the prescription routed to another pharmacy. Your safety is important to [place]. If you have drug allergies check your prescription carefully.  For the next 24 hours you can use MyChart to ask questions about today's visit, request a non-urgent call back, or ask for a work or school excuse. You will get an email in the next two days asking about your experience. I hope that your e-visit has been valuable and will speed your recovery.  Greater than 5 minutes, yet less than 10 minutes of time have been spent researching, coordinating, and implementing care for this patient today.

## 2021-02-24 ENCOUNTER — Telehealth: Payer: No Typology Code available for payment source | Admitting: Physician Assistant

## 2021-02-24 DIAGNOSIS — W57XXXA Bitten or stung by nonvenomous insect and other nonvenomous arthropods, initial encounter: Secondary | ICD-10-CM | POA: Diagnosis not present

## 2021-02-24 DIAGNOSIS — T148XXA Other injury of unspecified body region, initial encounter: Secondary | ICD-10-CM

## 2021-02-24 NOTE — Progress Notes (Signed)
E Visit for Insect Sting  Thank you for describing the insect sting for [place].  Here is how we plan to help!  An uncomplicated insect sting that just occurred and can be closely followed using the instructions in your care plan.  The 2 greatest risks from insect stings are allergic reaction, which can be fatal in some people and infection, which is more common and less serious.  Bees, wasps, yellow jackets, and hornets belong to a class of insects called Hymenoptera.  Most insect stings cause only minor discomfort.  Stings can happen anywhere on the body and can be painful.  Most stings are from honey bees or yellow jackets.  Fire ants can sting multiple times.  The sites of the stings are more likely to become infected.    Provided a home care guide for insect stings and instructions on when to call for help.  What can be used to prevent Insect Stings?  Insect repellant with at least 20% DEET.  Wearing long pants and shirts with socks and shoes.  Wear dark or drab-colored clothes rather than bright colors.  Avoid using perfumes and hair sprays; these attract insects.  HOME CARE ADVICE:  1. Stinger removal: The stinger looks like a tiny black dot in the sting. Use a fingernail, credit card edge, or knife-edge to scrape it off.  Don't pull it out because it squeezes out more venom. If the stinger is below the skin surface, leave it alone.  It will be shed with normal skin healing. 2. Use cold compresses to the area of the sting for 10-20 minutes.  You may repeat this as needed to relieve symptoms of pain and swelling. 3.  For pain relief, take acetominophen 650 mg 4-6 hours as needed or ibuprofen 400 mg every 6-8 hours as needed or naproxen 250-500 mg every 12 hours as needed. 4.  You can also use hydrocortisone cream 0.5% or 1% up to 4 times daily as needed for itching. 5.  If the sting becomes very itchy, take Benadryl 25-50 mg, follow directions on box. 6.  Wash the area 2-3 times  daily with antibacterial soap and warm water. 7. Call your Doctor if: Fever, a severe headache, or rash occur in the next 2 weeks. Sting area begins to look infected. Redness and swelling worsens after home treatment. Your current symptoms become worse.    MAKE SURE YOU:  Understand these instructions. Will watch your condition. Will get help right away if you are not doing well or get worse.  Thank you for choosing an e-visit. Your e-visit answers were reviewed by a board certified advanced clinical practitioner to complete your personal care plan. Depending upon the condition, your plan could have included both over the counter or prescription medications. Please review your pharmacy choice. Be sure that the pharmacy you have chosen is open so that you can pick up your prescription now.  If there is a problem you may message your provider in MyChart to have the prescription routed to another pharmacy. Your safety is important to [place]. If you have drug allergies check your prescription carefully.  For the next 24 hours, you can use MyChart to ask questions about today's visit, request a non-urgent call back, or ask for a work or school excuse from your e-visit provider. You will get an email in the next two days asking about your experience. I hope that your e-visit has been valuable and will speed your recovery.  I provided 5 minutes  of non face-to-face time during this encounter for chart review and documentation.

## 2021-05-10 ENCOUNTER — Ambulatory Visit (HOSPITAL_COMMUNITY): Payer: No Typology Code available for payment source | Admitting: Licensed Clinical Social Worker

## 2021-09-25 ENCOUNTER — Telehealth: Payer: Self-pay

## 2021-09-25 DIAGNOSIS — B001 Herpesviral vesicular dermatitis: Secondary | ICD-10-CM

## 2021-09-25 MED ORDER — VALACYCLOVIR HCL 500 MG PO TABS: 500.0000 mg | ORAL_TABLET | Freq: Every day | ORAL | 0 refills | Status: DC

## 2021-09-25 NOTE — Telephone Encounter (Signed)
Pt requesting refill of Valtrex. Refill sent to new pharmacy. Pt will call office to schedule annual.

## 2021-12-19 ENCOUNTER — Other Ambulatory Visit (HOSPITAL_BASED_OUTPATIENT_CLINIC_OR_DEPARTMENT_OTHER): Payer: Self-pay | Admitting: Family Medicine

## 2021-12-19 DIAGNOSIS — E079 Disorder of thyroid, unspecified: Secondary | ICD-10-CM

## 2021-12-19 DIAGNOSIS — R221 Localized swelling, mass and lump, neck: Secondary | ICD-10-CM

## 2021-12-19 DIAGNOSIS — Z8639 Personal history of other endocrine, nutritional and metabolic disease: Secondary | ICD-10-CM

## 2021-12-20 ENCOUNTER — Telehealth (HOSPITAL_BASED_OUTPATIENT_CLINIC_OR_DEPARTMENT_OTHER): Payer: Self-pay

## 2021-12-20 ENCOUNTER — Ambulatory Visit (HOSPITAL_BASED_OUTPATIENT_CLINIC_OR_DEPARTMENT_OTHER): Payer: 59

## 2021-12-28 IMAGING — US US THYROID
1 series · 13 of 25 positions shown · non-contrast
Comparison: None.

CLINICAL DATA: Other.  Right thyroid nodule

EXAM:
THYROID ULTRASOUND
TECHNIQUE: Ultrasound examination of the thyroid gland and adjacent soft
tissues was performed.

[Series 1: us thyroid · 13 of 27 slices shown]
[im 1/27]
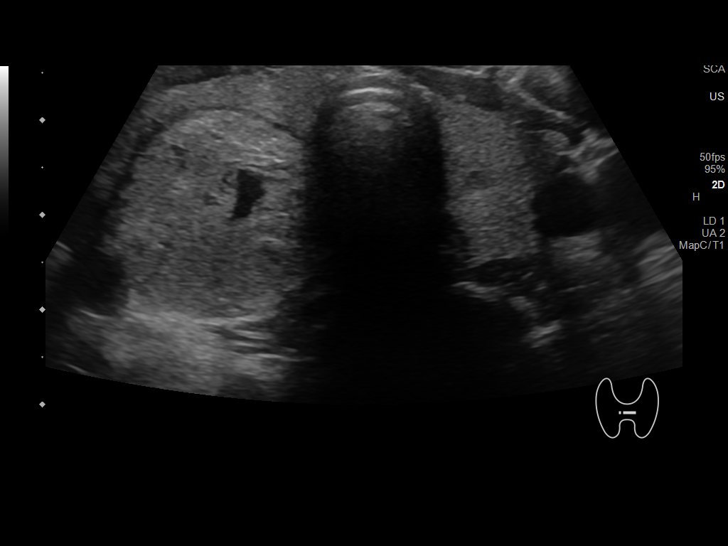
[im 3/27]
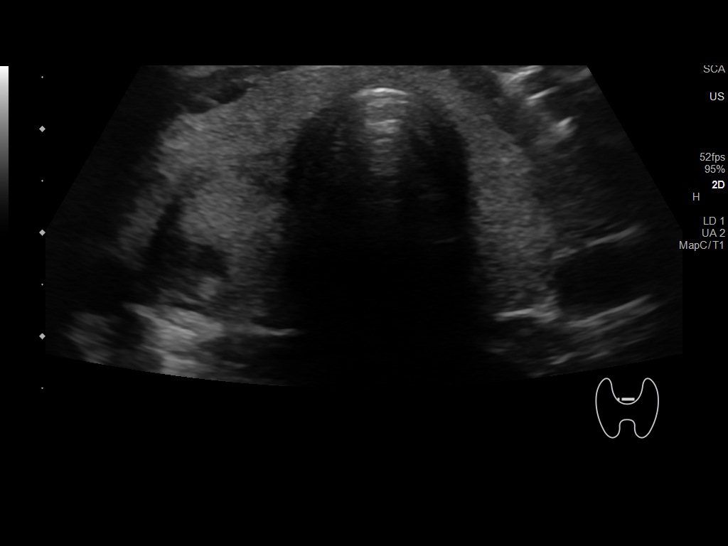
[im 5/27]
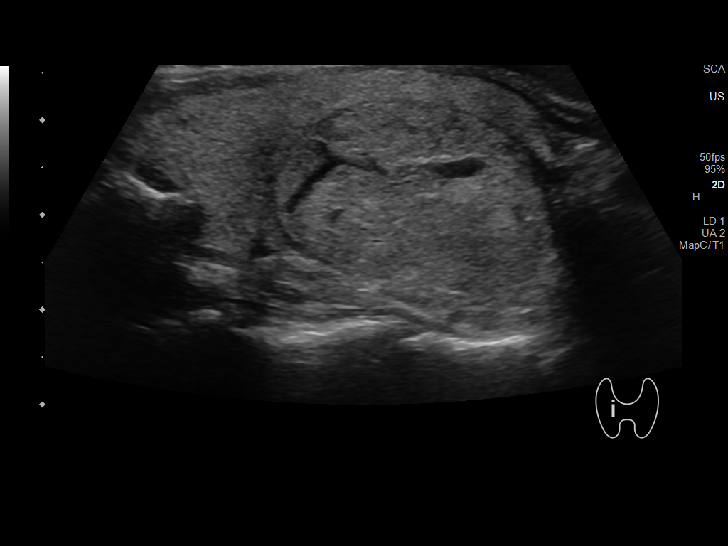
[im 7/27]
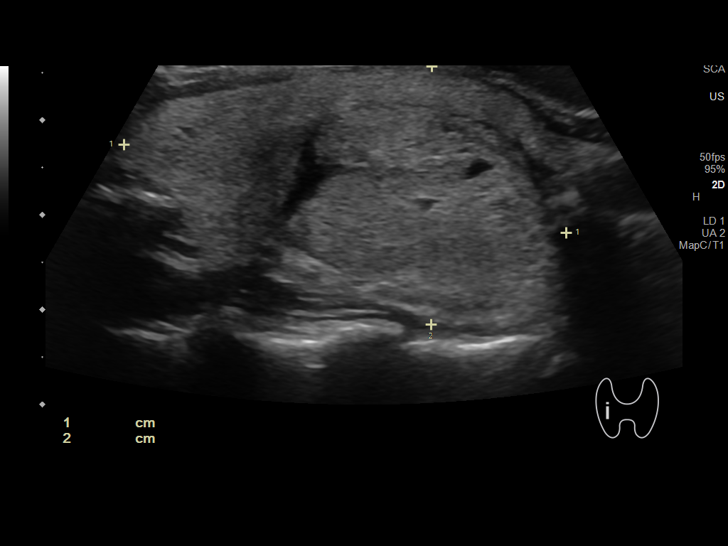
[im 9/27]
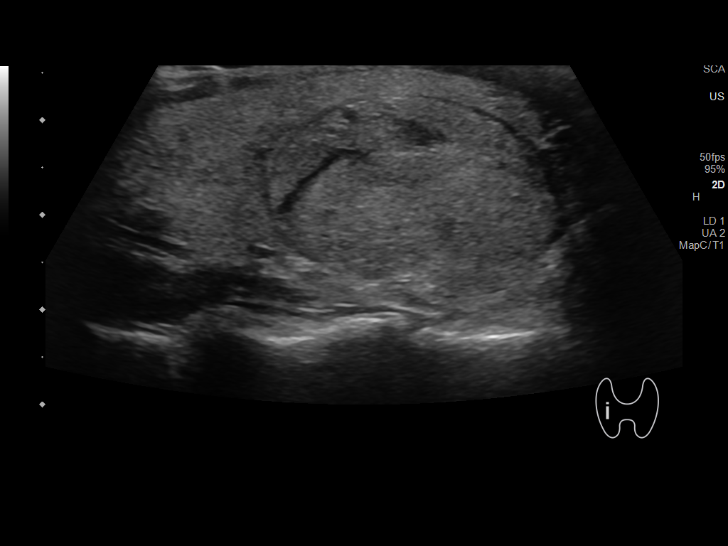
[im 11/27]
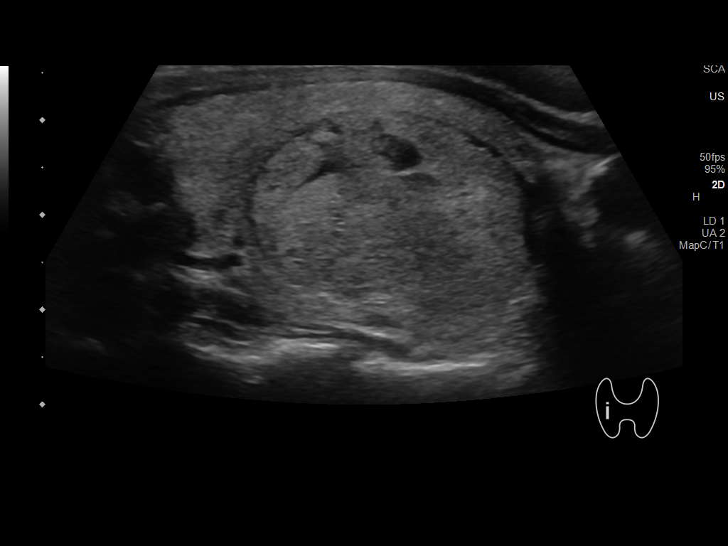
[im 14/27]
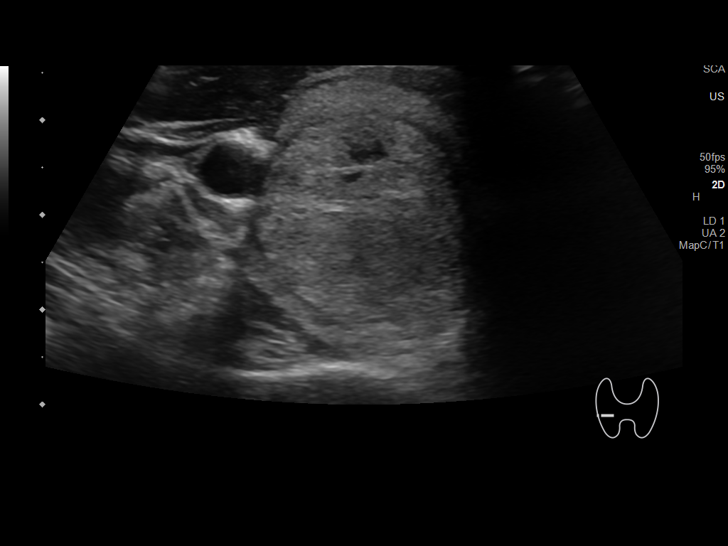
[im 16/27]
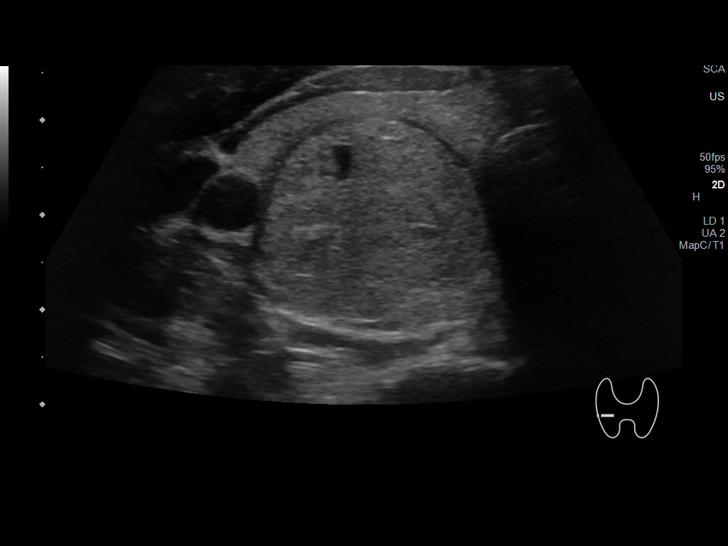
[im 18/27]
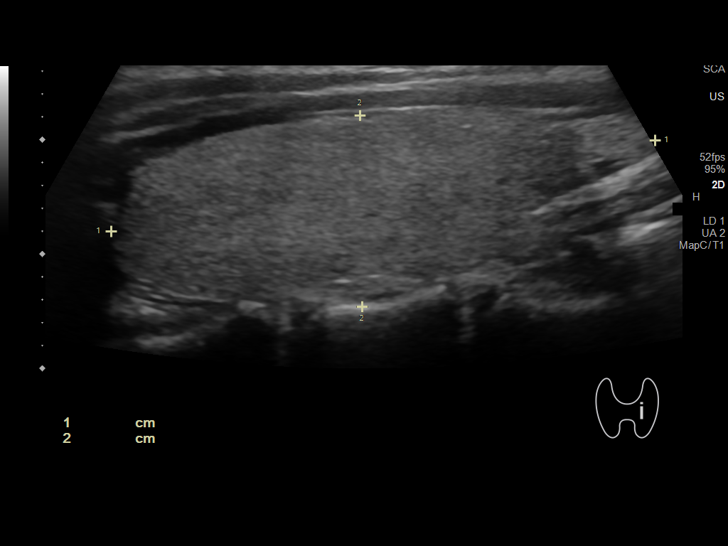
[im 20/27]
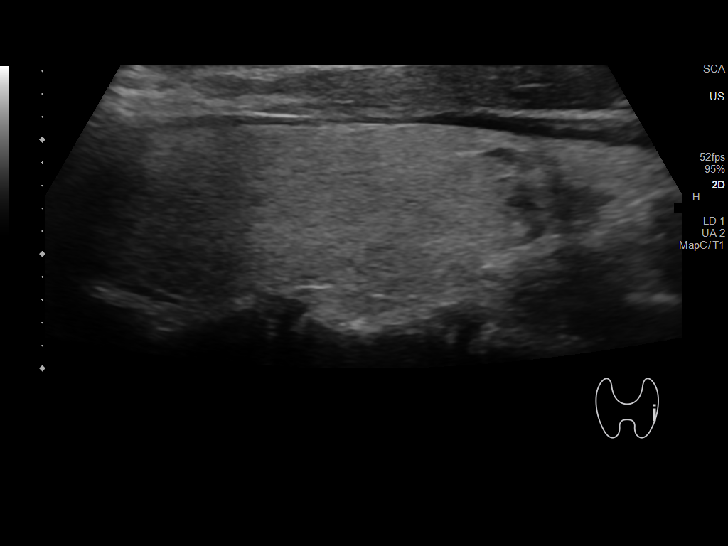
[im 22/27]
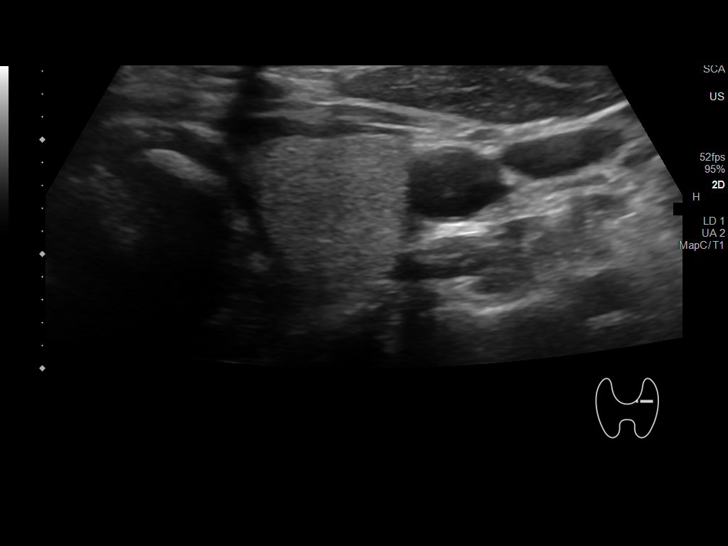
[im 24/27]
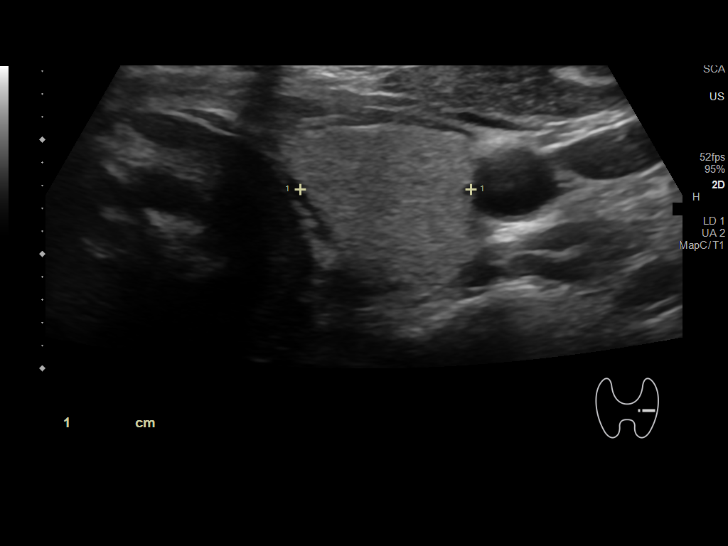
[im 27/27]
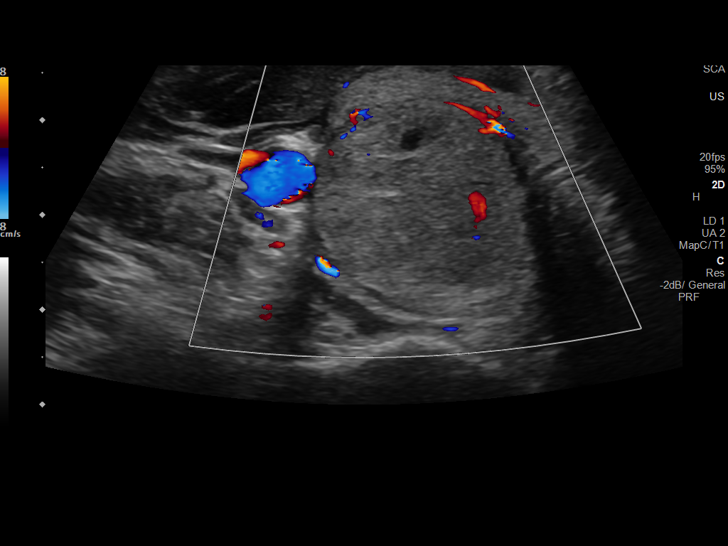

[13 of 25 positions shown; findings below may reference images not displayed]

FINDINGS: Parenchymal Echotexture: Normal

Isthmus: 3.5 cm

Right lobe: 4.8 x 2.7 x 2.4 cm

Left lobe: 4.8 x 1.7 x 1.5 cm

_________________________________________________________

Estimated total number of nodules >/= 1 cm: 1

Number of spongiform nodules >/=  2 cm not described below (TR1): 0

Number of mixed cystic and solid nodules >/= 1.5 cm not described
below (TR2): 0

_________________________________________________________

Nodule # 1:

Location: Right; Mid

Maximum size: 3.1 cm; Other 2 dimensions: 2.0 x 2.2 cm

Composition: solid/almost completely solid (2)

Echogenicity: isoechoic (1)

Shape: not taller-than-wide (0)

Margins: smooth (0)

Echogenic foci: none (0)

ACR TI-RADS total points: 3.

ACR TI-RADS risk category: TR3 (3 points).

ACR TI-RADS recommendations:

**Given size (>/= 2.5 cm) and appearance, fine needle aspiration of
this mildly suspicious nodule should be considered based on TI-RADS
criteria.

_________________________________________________________
IMPRESSION: Solitary 3.1 cm TI-RADS category 3 (mildly suspicious) nodule in the
right mid to lower gland meets criteria to consider fine-needle
aspiration biopsy. Biopsy is recommended.

The above is in keeping with the ACR TI-RADS recommendations - [HOSPITAL] 6395;[DATE].

## 2022-01-03 IMAGING — US US FNA BIOPSY THYROID 1ST LESION
1 series · 13 of 18 positions shown · non-contrast
Comparison: Ultrasound thyroid 10/21/2020

MEDICATIONS:
1% lidocaine 5 mL

COMPLICATIONS:
None immediate.

INDICATION: Indeterminate right mid thyroid nodule

EXAM:
ULTRASOUND GUIDED FINE NEEDLE ASPIRATION OF INDETERMINATE THYROID
NODULE
TECHNIQUE: Informed written consent was obtained from the patient after a
discussion of the risks, benefits and alternatives to treatment.
Questions regarding the procedure were encouraged and answered. A
timeout was performed prior to the initiation of the procedure.

[Series 1: us fna biopsy thyroid 1st lesion · 0.06mm/px · 18 acquisitions, 13 frames shown]
[im 1/18]
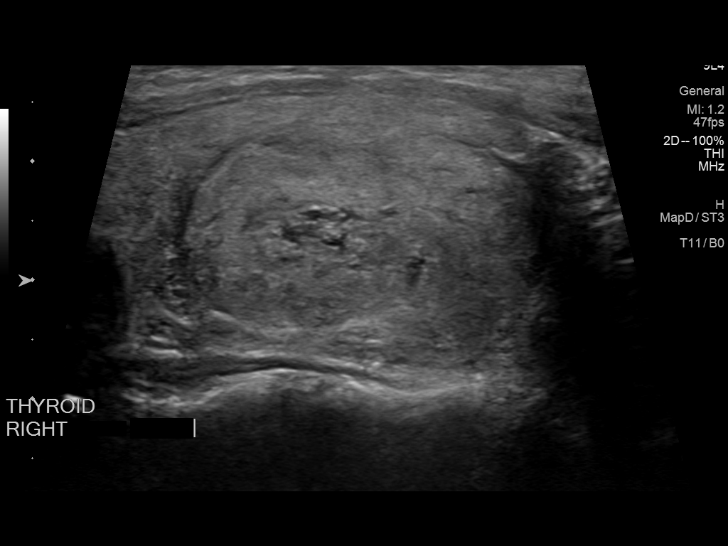
[im 3/18]
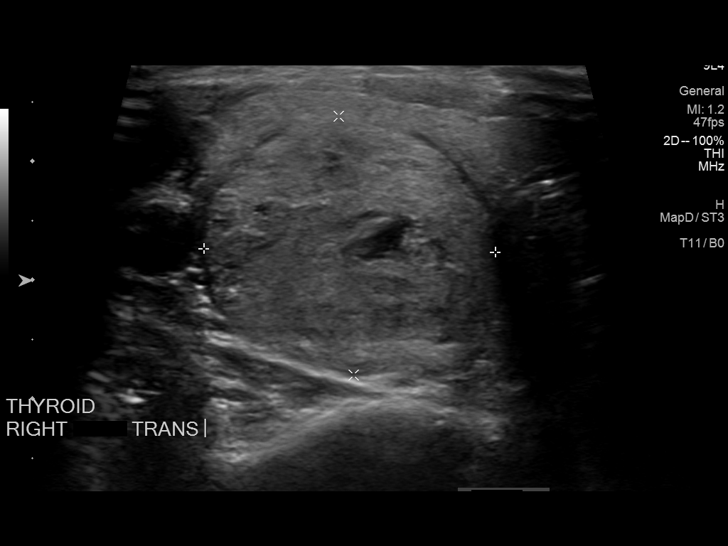
[im 4/18]
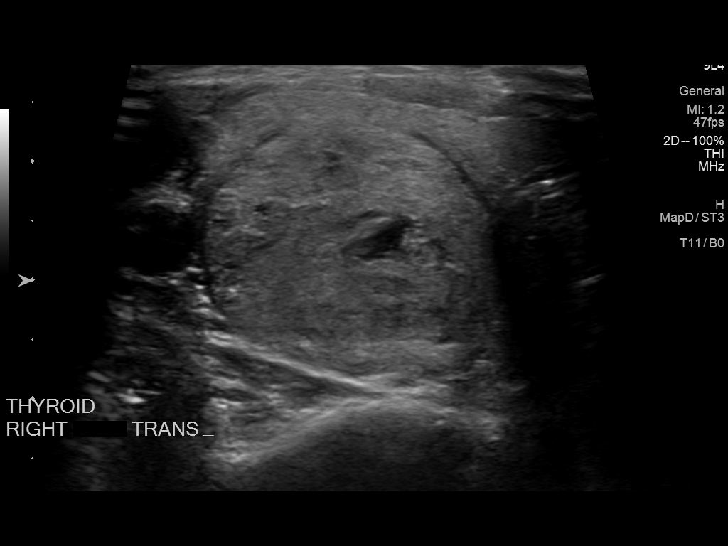
[im 5/18]
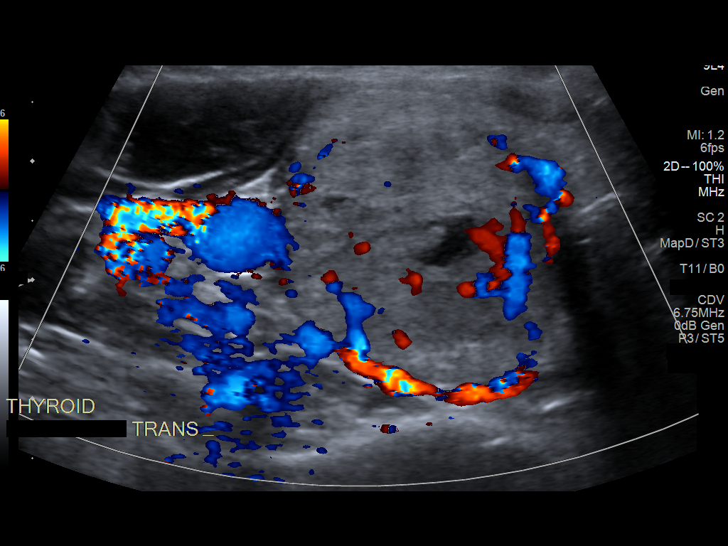
[im 7/18]
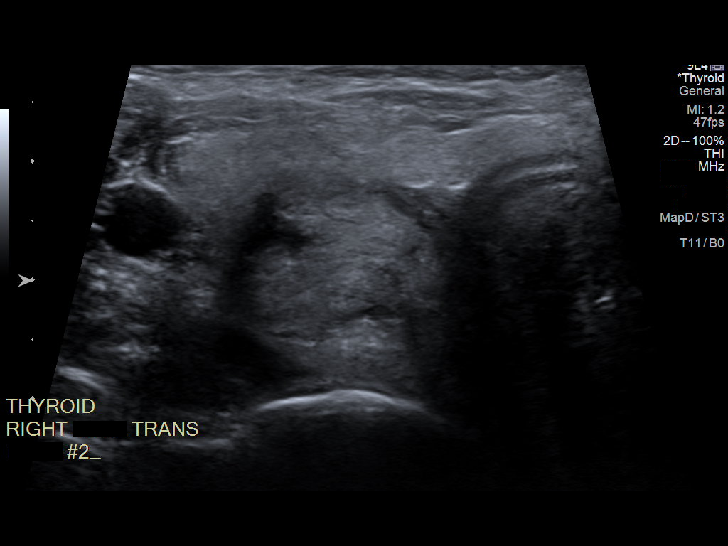
[im 8/18]
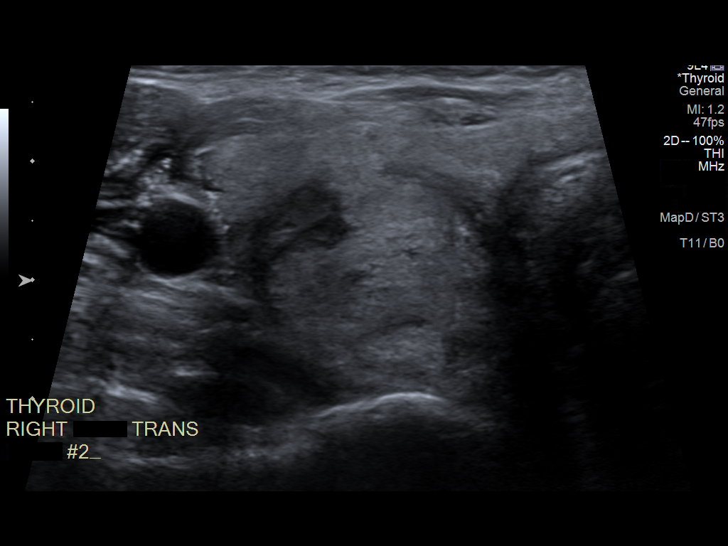
[im 10/18]
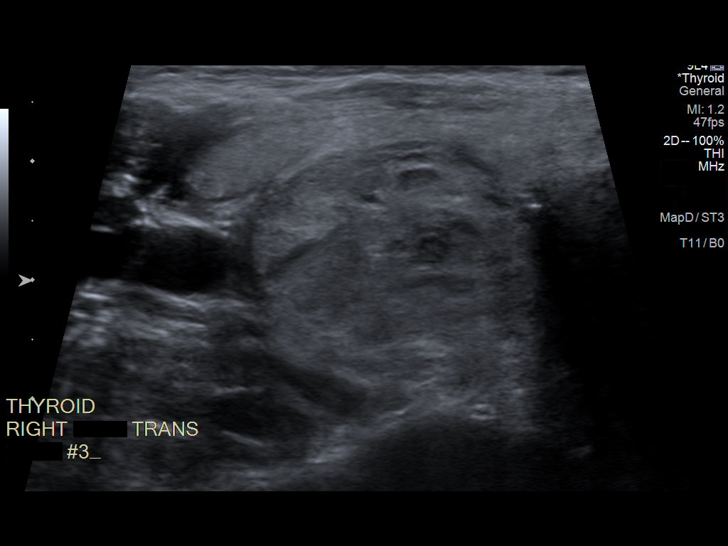
[im 11/18]
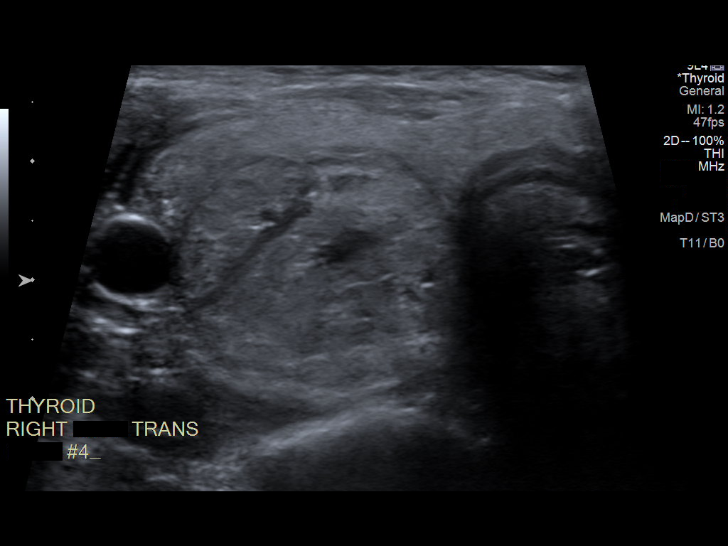
[im 12/18]
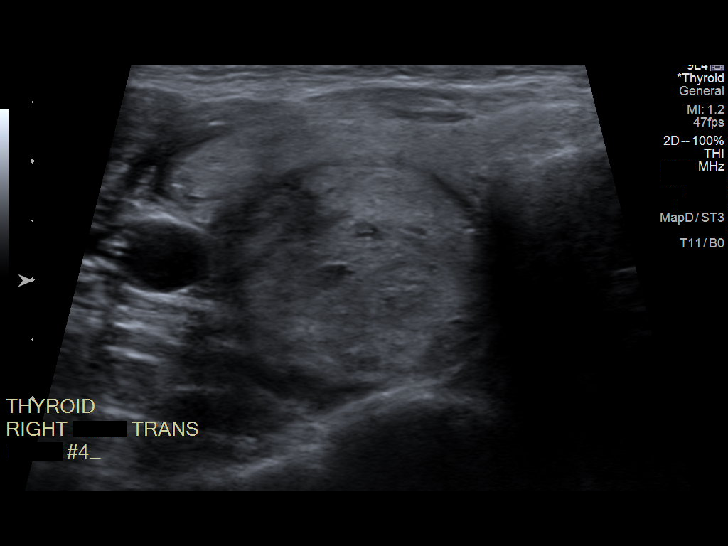
[im 14/18]
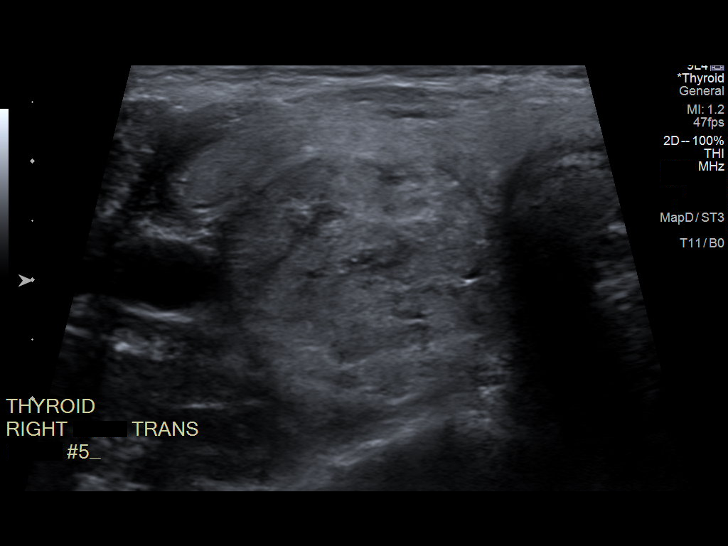
[im 15/18]
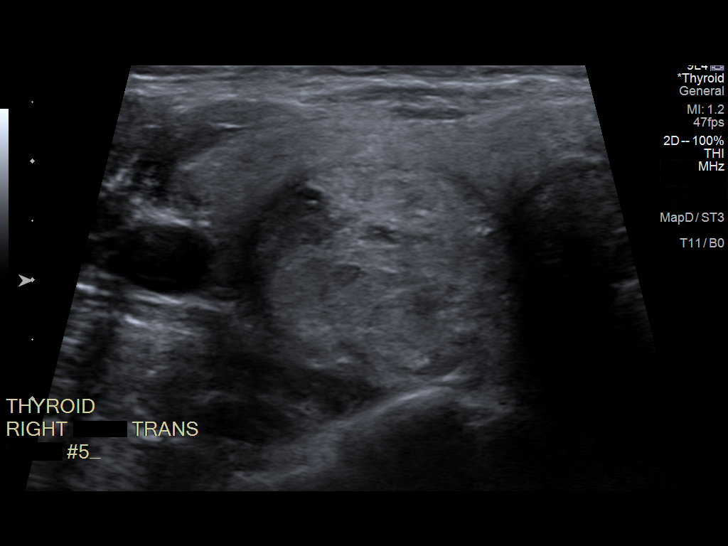
[im 16/18]
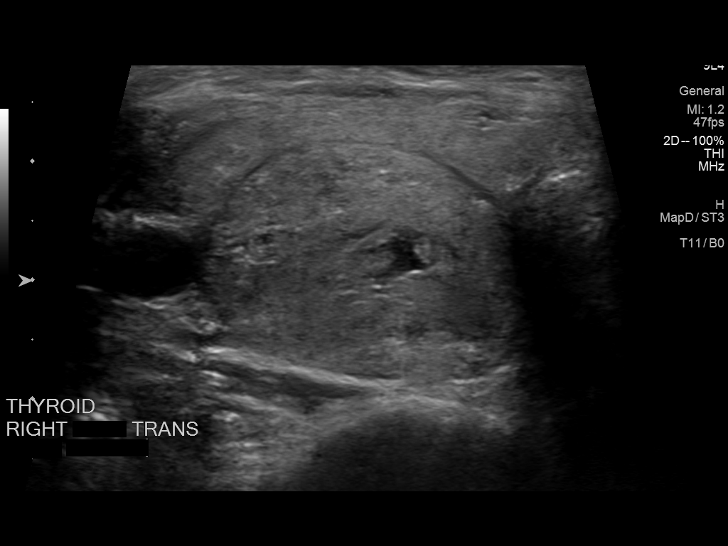
[im 18/18]
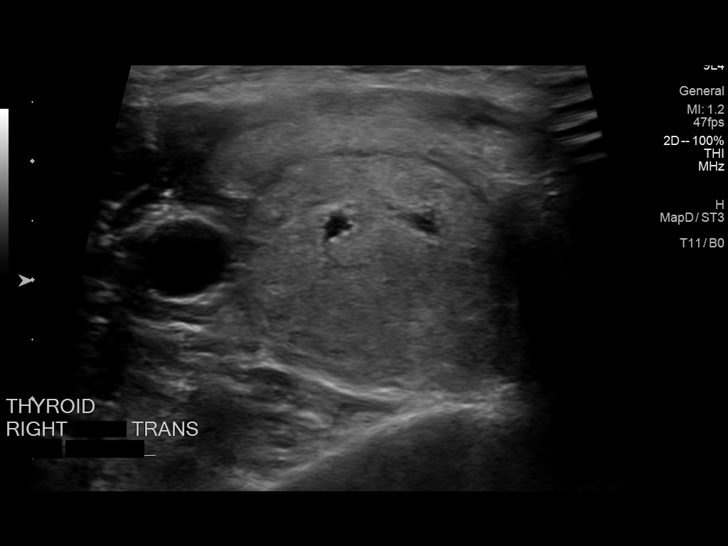

[13 of 18 positions shown; findings below may reference images not displayed]

Pre-procedural ultrasound scanning demonstrated unchanged size and
appearance of the indeterminate nodule within the right mid thyroid.

The procedure was planned. The neck was prepped in the usual sterile
fashion, and a sterile drape was applied covering the operative
field. A timeout was performed prior to the initiation of the
procedure. Local anesthesia was provided with 1% lidocaine.

Under direct ultrasound guidance, 5 FNA biopsies were performed of
the with a 25 gauge needle. Multiple ultrasound images were saved
for procedural documentation purposes. The samples were prepared and
submitted to pathology. Two of the samples were prepared for Afirma
testing.

Limited post procedural scanning was negative for hematoma or
additional complication. Dressings were placed. The patient
tolerated the above procedures procedure well without immediate
postprocedural complication.
FINDINGS: Nodule reference number based on prior diagnostic ultrasound: 1

Maximum size: 3.1 cm

Location: Right; Mid

ACR TI-RADS risk category: TR3 (3 points)

Reason for biopsy: meets ACR TI-RADS criteria

Ultrasound imaging confirms appropriate placement of the needles
within the thyroid nodule.
IMPRESSION: Technically successful ultrasound guided fine needle aspiration of
right mid thyroid nodule. Read by: Diep Hawkins, NP

## 2022-02-17 ENCOUNTER — Telehealth: Payer: 59 | Admitting: Urgent Care

## 2022-02-17 DIAGNOSIS — J039 Acute tonsillitis, unspecified: Secondary | ICD-10-CM | POA: Diagnosis not present

## 2022-02-17 MED ORDER — AMOXICILLIN 500 MG PO CAPS: 500.0000 mg | ORAL_CAPSULE | Freq: Two times a day (BID) | ORAL | 0 refills | Status: DC

## 2022-02-17 NOTE — Progress Notes (Signed)

## 2022-02-20 MED ORDER — CEFDINIR 300 MG PO CAPS: 300.0000 mg | ORAL_CAPSULE | Freq: Two times a day (BID) | ORAL | 0 refills | Status: DC

## 2022-02-20 NOTE — Addendum Note (Signed)
Addended by: Waldon Merl on: 02/20/2022 07:19 AM   Modules accepted: Orders

## 2022-05-10 ENCOUNTER — Other Ambulatory Visit: Payer: Self-pay | Admitting: Nurse Practitioner

## 2022-05-10 DIAGNOSIS — E041 Nontoxic single thyroid nodule: Secondary | ICD-10-CM

## 2022-05-16 ENCOUNTER — Other Ambulatory Visit: Payer: Self-pay | Admitting: Nurse Practitioner

## 2022-05-16 DIAGNOSIS — E041 Nontoxic single thyroid nodule: Secondary | ICD-10-CM

## 2022-05-30 ENCOUNTER — Other Ambulatory Visit: Payer: Self-pay | Admitting: Nurse Practitioner

## 2022-05-30 ENCOUNTER — Ambulatory Visit
Admission: RE | Admit: 2022-05-30 | Discharge: 2022-05-30 | Disposition: A | Discharge status: Home / Self Care | Payer: Self-pay | Source: Ambulatory Visit | Attending: Nurse Practitioner | Admitting: Nurse Practitioner

## 2022-05-30 DIAGNOSIS — E041 Nontoxic single thyroid nodule: Secondary | ICD-10-CM

## 2023-07-11 ENCOUNTER — Ambulatory Visit: Payer: 59 | Admitting: Family Medicine

## 2023-07-24 ENCOUNTER — Encounter: Payer: Self-pay | Admitting: Family Medicine

## 2023-07-24 ENCOUNTER — Telehealth: Payer: Self-pay | Admitting: Family Medicine

## 2023-07-24 ENCOUNTER — Ambulatory Visit: Payer: 59 | Admitting: Family Medicine

## 2023-07-24 ENCOUNTER — Encounter (HOSPITAL_COMMUNITY): Payer: Self-pay

## 2023-07-24 ENCOUNTER — Other Ambulatory Visit (HOSPITAL_COMMUNITY): Payer: Self-pay

## 2023-07-24 VITALS — BP 118/85 | HR 66 | Ht 65.0 in | Wt 158.0 lb

## 2023-07-24 DIAGNOSIS — B001 Herpesviral vesicular dermatitis: Secondary | ICD-10-CM

## 2023-07-24 DIAGNOSIS — L700 Acne vulgaris: Secondary | ICD-10-CM | POA: Diagnosis not present

## 2023-07-24 DIAGNOSIS — J302 Other seasonal allergic rhinitis: Secondary | ICD-10-CM | POA: Diagnosis not present

## 2023-07-24 DIAGNOSIS — Z0000 Encounter for general adult medical examination without abnormal findings: Secondary | ICD-10-CM

## 2023-07-24 DIAGNOSIS — Z803 Family history of malignant neoplasm of breast: Secondary | ICD-10-CM

## 2023-07-24 MED ORDER — TRETINOIN 0.1 % EX CREA
TOPICAL_CREAM | Freq: Every day | CUTANEOUS | 3 refills | Status: DC
  Filled 2023-07-24 – 2023-07-27 (×2): qty 20, 30d supply, fill #0

## 2023-07-24 MED ORDER — FLUTICASONE PROPIONATE 50 MCG/ACT NA SUSP
2.0000 | Freq: Every day | NASAL | 6 refills | Status: DC
  Filled 2023-07-24 – 2023-07-27 (×2): qty 16, 30d supply, fill #0

## 2023-07-24 MED ORDER — VALACYCLOVIR HCL 500 MG PO TABS
500.0000 mg | ORAL_TABLET | Freq: Two times a day (BID) | ORAL | 3 refills | Status: DC
  Filled 2023-07-24 – 2023-07-27 (×2): qty 30, 15d supply, fill #0

## 2023-07-24 NOTE — Assessment & Plan Note (Signed)
Pt doing well with diet and exercise - have gone ahead and ordered blood work due to concerns of hyperlipidemia and her hx of thyroid abnormalities

## 2023-07-24 NOTE — Progress Notes (Signed)
New patient visit   Patient: Joan Guerra   DOB: [birth year]   32 y.o. Female  MRN: [id number] Visit Date: 07/24/2023  Today's healthcare provider: Charlton Amor, DO   Chief Complaint  Patient presents with   New Patient (Initial Visit)    Establish care    SUBJECTIVE    Chief Complaint  Patient presents with   New Patient (Initial Visit)    Establish care   HPI HPI     New Patient (Initial Visit)    Additional comments: Establish care      Last edited by Roselyn Reef, CMA on 07/24/2023  8:45 AM.       Pt presents to establish care.   Pmh includes  2 benign thyroid biopsies   Has had 2 children--5 year and 3 year   MGM- Breast cancer twice Two of her M aunt- breast cancer  Htn: Mom and dad DM: none Mom and dad have strong heart history    Review of Systems  Constitutional:  Negative for activity change, fatigue and fever.  Respiratory:  Negative for cough and shortness of breath.   Cardiovascular:  Negative for chest pain.  Gastrointestinal:  Negative for abdominal pain.  Genitourinary:  Negative for difficulty urinating.       Current Meds  Medication Sig   fluticasone (FLONASE) 50 MCG/ACT nasal spray Place 2 sprays into both nostrils daily.   MAGNESIUM PO Take by mouth.   tretinoin (RETIN-A) 0.1 % cream Apply topically at bedtime.   VITAMIN D PO Take by mouth.    OBJECTIVE    BP 118/85 (BP Location: Left Arm, Patient Position: Sitting, Cuff Size: Large)   Pulse 66   Ht 5\' 5"  (1.651 m)   Wt 158 lb (71.7 kg)   SpO2 100%   BMI 26.29 kg/m   Physical Exam Vitals and nursing note reviewed.  Constitutional:      General: She is not in acute distress.    Appearance: Normal appearance.  HENT:     Head: Normocephalic and atraumatic.     Right Ear: External ear normal.     Left Ear: External ear normal.     Nose: Nose normal.  Eyes:     Conjunctiva/sclera: Conjunctivae normal.  Cardiovascular:     Rate and Rhythm: Normal rate and  regular rhythm.  Pulmonary:     Effort: Pulmonary effort is normal.     Breath sounds: Normal breath sounds.  Neurological:     General: No focal deficit present.     Mental Status: She is alert and oriented to person, place, and time.  Psychiatric:        Mood and Affect: Mood normal.        Behavior: Behavior normal.        Thought Content: Thought content normal.        Judgment: Judgment normal.        ASSESSMENT & PLAN    Problem List Items Addressed This Visit       Digestive   Recurrent cold sores    Have sent in valtrex refill      Relevant Medications   valACYclovir (VALTREX) 500 MG tablet     Musculoskeletal and Integument   Acne vulgaris - Primary    Have sent refill on retinol cream. Pt's husband had a vasectomy       Relevant Medications   tretinoin (RETIN-A) 0.1 % cream     Other   Family history  of breast cancer    Pt has a family history of breast cancer in two maternal aunts and her maternal grandmother. Will refer to obgyn to see if she needs early surveillance for breast cancer      Relevant Orders   Ambulatory referral to Obstetrics / Gynecology   Routine adult health maintenance    Pt doing well with diet and exercise - have gone ahead and ordered blood work due to concerns of hyperlipidemia and her hx of thyroid abnormalities      Relevant Orders   TSH + free T4   Basic Metabolic Panel (BMET)   Lipid panel   Other Visit Diagnoses     Seasonal allergic rhinitis, unspecified trigger       Relevant Medications   fluticasone (FLONASE) 50 MCG/ACT nasal spray       Return in about 1 year (around 07/23/2024).      Meds ordered this encounter  Medications   valACYclovir (VALTREX) 500 MG tablet    Sig: Take 1 tablet (500 mg total) by mouth 2 (two) times daily. For 3 days.    Dispense:  30 tablet    Refill:  3   tretinoin (RETIN-A) 0.1 % cream    Sig: Apply topically at bedtime.    Dispense:  20 g    Refill:  3    Generic only    fluticasone (FLONASE) 50 MCG/ACT nasal spray    Sig: Place 2 sprays into both nostrils daily.    Dispense:  16 g    Refill:  6    Orders Placed This Encounter  Procedures   TSH + free T4   Basic Metabolic Panel (BMET)    Order Specific Question:   Has the patient fasted?    Answer:   No   Lipid panel    Order Specific Question:   Has the patient fasted?    Answer:   No    Order Specific Question:   Release to patient    Answer:   Immediate   Ambulatory referral to Obstetrics / Gynecology    Referral Priority:   Routine    Referral Type:   Consultation    Referral Reason:   Specialty Services Required    Requested Specialty:   Obstetrics and Gynecology     Charlton Amor, DO  [facility] at [facility] [phone] (phone) [phone] (fax)  [facility]

## 2023-07-24 NOTE — Telephone Encounter (Signed)
Patient 's prescriptions were sent to the wrong pharmacy they need to be sent to [facility] [place]  Phone [phone]  Flonase  Prenatal MV 0.4-25mg  RETINA  01.5 Cream

## 2023-07-24 NOTE — Assessment & Plan Note (Signed)
Pt has a family history of breast cancer in two maternal aunts and her maternal grandmother. Will refer to obgyn to see if she needs early surveillance for breast cancer

## 2023-07-24 NOTE — Assessment & Plan Note (Signed)
Have sent in valtrex refill

## 2023-07-24 NOTE — Assessment & Plan Note (Signed)
Have sent refill on retinol cream. Pt's husband had a vasectomy

## 2023-07-25 ENCOUNTER — Other Ambulatory Visit: Payer: Self-pay

## 2023-07-25 DIAGNOSIS — B001 Herpesviral vesicular dermatitis: Secondary | ICD-10-CM

## 2023-07-27 ENCOUNTER — Other Ambulatory Visit (HOSPITAL_BASED_OUTPATIENT_CLINIC_OR_DEPARTMENT_OTHER): Payer: Self-pay

## 2023-07-27 MED ORDER — VALACYCLOVIR HCL 500 MG PO TABS
500.0000 mg | ORAL_TABLET | Freq: Two times a day (BID) | ORAL | 3 refills | Status: DC
Start: 2023-07-24 — End: 2023-07-27

## 2023-07-27 MED ORDER — TRETINOIN 0.01 % EX GEL: CUTANEOUS | 3 refills | Status: DC

## 2023-07-27 MED ORDER — FLUTICASONE PROPIONATE 50 MCG/ACT NA SUSP: 2.0000 | Freq: Every day | NASAL | 6 refills | Status: DC

## 2023-08-01 LAB — BASIC METABOLIC PANEL
BUN/Creatinine Ratio: 19 (ref 9–23)
BUN: 12 mg/dL (ref 6–20)
CO2: 22 mmol/L (ref 20–29)
Calcium: 9.7 mg/dL (ref 8.7–10.2)
Chloride: 100 mmol/L (ref 96–106)
Creatinine, Ser: 0.64 mg/dL (ref 0.57–1.00)
Glucose: 89 mg/dL (ref 70–99)
Potassium: 4.5 mmol/L (ref 3.5–5.2)
Sodium: 138 mmol/L (ref 134–144)
eGFR: 121 mL/min/{1.73_m2} (ref >59)

## 2023-08-01 LAB — LIPID PANEL
Chol/HDL Ratio: 3.3 ratio (ref 0.0–4.4)
Cholesterol, Total: 184 mg/dL (ref 100–199)
HDL: 55 mg/dL (ref >39)
LDL Chol Calc (NIH): 114 mg/dL — ABNORMAL HIGH (ref 0–99)
Triglycerides: 80 mg/dL (ref 0–149)
VLDL Cholesterol Cal: 15 mg/dL (ref 5–40)

## 2023-08-01 LAB — TSH+FREE T4
Free T4: 1.38 ng/dL (ref 0.82–1.77)
TSH: 1.9 u[IU]/mL (ref 0.450–4.500)

## 2023-08-06 ENCOUNTER — Other Ambulatory Visit: Payer: Self-pay | Admitting: Family Medicine

## 2023-08-06 DIAGNOSIS — B001 Herpesviral vesicular dermatitis: Secondary | ICD-10-CM

## 2023-10-23 NOTE — Progress Notes (Signed)
 ANNUAL EXAM Patient name: Sayuri Rhames MRN [id number]  Date of birth: August 15, 1991 Chief Complaint:   Annual Exam  History of Present Illness:   Emyah Roznowski is a 33 y.o. [phone] with Patient's last menstrual period was 10/06/2023. being seen today for a routine annual exam.  Current complaints: Interested in genetic screening for breast cancer.   The pregnancy intention screening data noted above was reviewed. Potential methods of contraception were discussed. The patient elected to proceed with Vasectomy.   Last pap 09/30/19. Results were:  NILM . H/O abnormal pap: no Last mammogram: n/a. Results were: N/A. Family h/o breast cancer: yes maternal grandmother and aunt; notes that her mother was tested and has a high risk variant but unsure of what type; sister also just had breast lump removed and they are waiting on path Last colonoscopy: n/a. Results were: N/A. Family h/o colorectal cancer: no HPV vaccine: no     07/24/2023    8:51 AM 08/17/2015    9:13 AM  Depression screen PHQ 2/9  Decreased Interest 1 0  Down, Depressed, Hopeless 0 0  PHQ - 2 Score 1 0  Altered sleeping 0   Tired, decreased energy 1   Change in appetite 0   Feeling bad or failure about yourself  0   Trouble concentrating 1   Moving slowly or fidgety/restless 0   Suicidal thoughts 0   PHQ-9 Score 3   Difficult doing work/chores Not difficult at all         07/24/2023    8:51 AM  GAD 7 : Generalized Anxiety Score  Nervous, Anxious, on Edge 0  Control/stop worrying 1  Worry too much - different things 1  Trouble relaxing 1  Restless 0  Easily annoyed or irritable 2  Afraid - awful might happen 2  Total GAD 7 Score 7  Anxiety Difficulty Not difficult at all     Review of Systems:   Pertinent items are noted in HPI Denies any headaches, blurred vision, fatigue, shortness of breath, chest pain, abdominal pain, abnormal vaginal discharge/itching/odor/irritation, problems with periods, bowel movements,  urination, or intercourse unless otherwise stated above. Pertinent History Reviewed:  Reviewed past medical,surgical, social and family history.  Reviewed problem list, medications and allergies. Physical Assessment:   Vitals:   10/24/23 0801  BP: 117/69  Pulse: 79  Weight: 152 lb (68.9 kg)  Height: 5' 5 (1.651 m)  Body mass index is 25.29 kg/m.        Physical Examination:   General appearance - well appearing, and in no distress  Mental status - alert, oriented to person, place, and time  Chest - respiratory effort normal  Heart - normal peripheral perfusion  Breasts - breasts appear normal, no suspicious masses, no skin or nipple changes or axillary nodes  Abdomen - soft, nontender, nondistended, no masses or organomegaly  Pelvic - VULVA: normal appearing vulva with no masses, tenderness or lesions  VAGINA: normal appearing vagina with normal color and discharge, no lesions  CERVIX: normal appearing cervix without discharge or lesions, no CMT  Thin prep pap is done with HR HPV cotesting  UTERUS: uterus is felt to be normal size, shape, consistency and nontender   ADNEXA: No adnexal masses or tenderness noted.  Chaperone present for exam  No results found for this or any previous visit (from the past 24 hours).  Assessment & Plan:  1) Well-Woman Exam Mammogram: @ 33yo, or sooner if problems. Will refer to cancer genetics to review  testing options Colonoscopy: @ 33yo, or sooner if problems Pap: Collected  Gardasil: Discussed, patient considering GC/CT: Offered, declines HIV/HCV: Offered, declines Vasectomy for contraception  Labs/procedures today:   Orders Placed This Encounter  Procedures   Ambulatory referral to Genetics   Meds: No orders of the defined types were placed in this encounter.  Follow-up: Return in about 1 year (around 10/23/2024) for annual exam or sooner as needed.  Kieth JAYSON Carolin, MD 10/24/2023 9:06 AM

## 2023-10-24 ENCOUNTER — Ambulatory Visit: Payer: 59 | Admitting: Obstetrics and Gynecology

## 2023-10-24 ENCOUNTER — Encounter: Payer: Self-pay | Admitting: Obstetrics and Gynecology

## 2023-10-24 ENCOUNTER — Other Ambulatory Visit (HOSPITAL_COMMUNITY)
Admission: RE | Admit: 2023-10-24 | Discharge: 2023-10-24 | Disposition: A | Discharge status: Home / Self Care | Payer: 59 | Source: Ambulatory Visit | Attending: Obstetrics and Gynecology | Admitting: Obstetrics and Gynecology

## 2023-10-24 VITALS — BP 117/69 | HR 79 | Ht 65.0 in | Wt 152.0 lb

## 2023-10-24 DIAGNOSIS — Z01419 Encounter for gynecological examination (general) (routine) without abnormal findings: Secondary | ICD-10-CM | POA: Insufficient documentation

## 2023-10-24 DIAGNOSIS — Z803 Family history of malignant neoplasm of breast: Secondary | ICD-10-CM | POA: Diagnosis not present

## 2023-10-30 ENCOUNTER — Encounter: Payer: Self-pay | Admitting: Obstetrics and Gynecology

## 2023-10-30 LAB — CYTOLOGY - PAP
Comment: NEGATIVE
Diagnosis: NEGATIVE
High risk HPV: NEGATIVE

## 2023-10-31 ENCOUNTER — Telehealth: Payer: Self-pay | Admitting: Genetic Counselor

## 2023-10-31 NOTE — Telephone Encounter (Signed)
Scheduled appointments per 2/12 scheduling message. Patient is aware of the made appointments.

## 2023-10-31 NOTE — Telephone Encounter (Signed)
Left a voicemail to call back

## 2023-12-18 ENCOUNTER — Telehealth: Payer: Self-pay

## 2023-12-18 NOTE — Telephone Encounter (Signed)
 Copied from CRM [id number]. Topic: Clinical - Medication Question >> Dec 17, 2023  3:29 PM Marlow Baars wrote: Reason for CRM: The patient called in stating her family is leaving on April 18TH for [place] and she has bad anxiety over it. Last time she flew there were problems with the plane and her nerves are on edge. Please assist patient further by calling something into her pharmacy at   [id number] [place],  - [address]  Phone: [phone] Fax: [phone]

## 2023-12-18 NOTE — Telephone Encounter (Signed)
 Forwarding message to DR. Metheney covering Dr. Tamera Punt.

## 2023-12-18 NOTE — Telephone Encounter (Signed)
 We can either do a MyChart visit or virtual visit.

## 2023-12-19 ENCOUNTER — Encounter (INDEPENDENT_AMBULATORY_CARE_PROVIDER_SITE_OTHER): Payer: Self-pay | Admitting: Family Medicine

## 2023-12-19 DIAGNOSIS — F40243 Fear of flying: Secondary | ICD-10-CM | POA: Diagnosis not present

## 2023-12-19 MED ORDER — ALPRAZOLAM 0.25 MG PO TABS: 0.2500 mg | ORAL_TABLET | Freq: Every day | ORAL | 0 refills | Status: DC | PRN

## 2023-12-19 MED ORDER — ALPRAZOLAM 0.25 MG PO TABS: 0.2500 mg | ORAL_TABLET | Freq: Every day | ORAL | 0 refills | Status: AC | PRN

## 2023-12-19 NOTE — Telephone Encounter (Signed)
 Attempted to contact the patient. No answer. Left a brief vm msg to return a call back for scheduling. Direct call back info provided.

## 2023-12-19 NOTE — Telephone Encounter (Signed)
Please see the MyChart message reply(ies) for my assessment and plan.    This patient gave consent for this Medical Advice Message and is aware that it may result in a bill to their insurance company, as well as the possibility of receiving a bill for a co-payment or deductible. They are an established patient, but are not seeking medical advice exclusively about a problem treated during an in person or video visit in the last seven days. I did not recommend an in person or video visit within seven days of my reply.    I spent a total of 6 minutes cumulative time within 7 days through MyChart messaging.  Uziah Sorter, MD   

## 2023-12-20 NOTE — Telephone Encounter (Signed)
 Pt sent mychart message wanting to know if there might be something that she could take that would be non-drowsy. Dr. Linford Arnold, please advise on this.

## 2023-12-20 NOTE — Telephone Encounter (Signed)
 Refer to [employer] message that was also sent by pt.

## 2024-01-23 ENCOUNTER — Inpatient Hospital Stay: Payer: 59

## 2024-01-23 ENCOUNTER — Encounter: Payer: Self-pay | Admitting: Family Medicine

## 2024-01-23 ENCOUNTER — Inpatient Hospital Stay: Payer: 59 | Admitting: Genetic Counselor

## 2024-01-23 ENCOUNTER — Encounter: Payer: Self-pay | Admitting: Genetic Counselor

## 2024-01-23 ENCOUNTER — Inpatient Hospital Stay: Attending: Genetic Counselor | Admitting: Genetic Counselor

## 2024-01-23 ENCOUNTER — Inpatient Hospital Stay

## 2024-01-23 ENCOUNTER — Telehealth: Payer: Self-pay | Admitting: Genetic Counselor

## 2024-01-23 DIAGNOSIS — Z803 Family history of malignant neoplasm of breast: Secondary | ICD-10-CM

## 2024-01-23 NOTE — Progress Notes (Addendum)
 REFERRING PROVIDER: Izell Marsh, MD [address] [place],  [place] [address]  PRIMARY PROVIDER:  Josepha Nickels, DO  PRIMARY REASON FOR VISIT:  Encounter Diagnosis  Name Primary?   Family history of breast cancer Yes    HISTORY OF PRESENT ILLNESS:   [name], a 33 y.o. female, was seen for a [facility] cancer genetics consultation at the request of Dr. Donetta Furl due to a family history of breast cancer.  [name] presents to clinic today to discuss the possibility of a hereditary predisposition to cancer, to discuss genetic testing, and to further clarify her future cancer risks, as well as potential cancer risks for family members.   [name] is a 33 y.o. female with no personal history of cancer.    SCREENING/RISK FACTORS:  Breast imaging within the last year: no Number of breast biopsies: 0. Colonoscopy: no; not examined. Hysterectomy: no.  Ovaries intact: yes.  Menarche was at age 53.  First live birth at age 35.  Menopausal status: premenopausal.  OCP use for approximately 0 years.  HRT use: 0 years. Dermatology screening: no  Blood transfusion within past 4 weeks: no  FAMILY HISTORY:  We obtained a detailed, 4-generation family history.  Significant diagnoses are listed below: Family History  Problem Relation Age of Onset   Breast cancer Maternal Aunt        dx late 60s-early 70s   Breast cancer Maternal Grandmother        x3; first dx in 72s; second dx in 3s; third dx in 14s w/ mets.   Prostate cancer Paternal Grandfather        dx 93s; recurrence in 81s   Cancer Other        ? GYN cancer in MGM's sister; ? pancreatic or colon cancer in MGM's brother     Ms. Point's said that her mother and her mother's sisters were involved in a research study ~15 years ago regarding familial cancer.  She recalls her mother being deemed 'at high risk for breast cancer' but is not aware if this was due to genetic testing results or family history only.  Records not  available for review today.    There is no reported Ashkenazi Jewish ancestry. There is no known consanguinity.  GENETIC COUNSELING ASSESSMENT: [name] is a 33 y.o. female with a family history of breast which is somewhat suggestive of a hereditary cancer syndrome and predisposition to cancer given the presence of related cancers in her maternal family. We, therefore, discussed and recommended the following at today's visit.   DISCUSSION: We discussed that 5 - 10% of cancer is hereditary, with most cases of hereditary breast cancer associated with mutations in BRCA1/2.  There are other genes that can be associated with hereditary breast, prostate, pancreatic, GYN, or other cancer syndromes.  We discussed that testing is beneficial for several reasons, including knowing about other cancer risks, identifying potential screening and risk-reduction options that may be appropriate, and understanding if other family members could be at an increased risk for cancer and allowing them to undergo genetic testing.  We reviewed the characteristics, features and inheritance patterns of hereditary cancer syndromes. We also discussed genetic testing, including the appropriate family members to test, the process of testing, insurance coverage and turn-around-time for results. We discussed the implications of a negative, positive, and variant of uncertain significant result.  We discussed that negative results would be uninformative given that Ms. Plass does not have a personal history of  cancer. We reviewed that her mother is a more informative relative to have genetic testing (if she had not already had comprehensive genetic testing) as she is more closely related to those in the family with cancer.  If her mother was found to have a mutation in a hereditary cancer gene several years ago, Ms. Casserly's chance of having the same mutation as her mother would be 50%.  We recommended Ms. Rottinghaus pursue genetic testing for a  panel that contains genes associated with breast and other cancers.  Ms. Smee was offered a common hereditary cancer panel (~40 genes) and an expanded pan-cancer panel (~70 genes). Ms. Dobson was informed of the benefits and limitations of each panel, including that expanded pan-cancer panels contain several genes that do not have clear management guidelines at this point in time.  We also discussed that as the number of genes included on a panel increases, the chances of variants of uncertain significance increases.    Based on Ms. Golliday's family history of multiple breast cancers in her maternal grandmother and breast cancer in her maternal aunt, she meets NCCN criteria for genetic testing. We discussed that if her out of pocket cost for testing is over $100, the laboratory should contact them to discuss self-pay options and/or patient pay assistance programs.  We also discussed option of $249 cash price through [employer]   We discussed the Genetic Information Non-Discrimination Act (GINA) of 2008, which helps protect individuals against genetic discrimination based on their genetic test results.  It impacts both health insurance and employment.  With health insurance, it protects against genetic test results being used for increased premiums or policy termination. For employment, it protects against hiring, firing and promoting decisions based on genetic test results.  GINA does not apply to those in the [employer], those who work for companies with less than 15 employees, and new life insurance or long-term disability insurance policies.  Health status due to a cancer diagnosis is not protected under GINA.  Her lifetime risk for breast cancer based on Tyrer-Cuzick risk model and reported personal/family history is 17.9%.  This is elevated compared to the general population (11.2% in her age group) but not in the 'high risk for breast cancer' category per NCCN guidelines (>20%).  This risk estimate  can change over time and should be repeated to reflect new information in her personal or family history in the future.  Annual breast imaging should beginning at age 33, or 37 years younger than the earliest onset of breast cancer in the family.  Per NCCN, individuals with a residual lifetime risk of 15-20% may be considered for supplemental screening on an individual basis.        PLAN: Ms. Mikkelsen did not wish to pursue genetic testing at today's visit.  She plans to talk with her mother about her prior research involvement and her genetics status. She plans to follow up with us  after this conversation to discuss genetic testing.   We recommend Ms. Meek continue to follow the cancer screening guidelines given by her primary healthcare provider.  Lastly, we encouraged Ms. Sahli to remain in contact with cancer genetics so that we can continuously update the family history and inform her of any changes in cancer genetics and testing that may be of benefit for this family.   Ms. Lothrop questions were answered to her satisfaction today. Our contact information was provided should additional questions or concerns arise. Thank you for the referral and allowing us   to share in the care of your patient.   Shean Gerding M. Ora Billing, MS, [facility] Genetic Counselor [email or web address] (P) [phone]    40 minutes were spent on the date of the encounter in service to the patient including preparation, face-to-face consultation, documentation and care coordination.  The patient was seen alone.  Drs. Iruku, Gudena and/or Maryalice Smaller were available to discuss this case as needed.   _______________________________________________________________________ For Office Staff:  Number of people involved in session: 1 Was an Intern/ student involved with case: no

## 2024-01-23 NOTE — Telephone Encounter (Signed)
 Called patient and answered questions about cost of testing.  Providing billing codes for appt cost.  Patient previously called to cancel appt for today but now wants to keep her appt.  Added her appt back on for 11am today.
